# Patient Record
Sex: Female | Born: 1965
Health system: Southern US, Community
[De-identification: ages and names within clinical notes are randomized; demographics above are authoritative.]

## PROBLEM LIST (undated history)

## (undated) DIAGNOSIS — I10 Essential (primary) hypertension: Secondary | ICD-10-CM

## (undated) HISTORY — DX: Essential (primary) hypertension: I10

---

## 2002-10-30 ENCOUNTER — Encounter: Admission: RE | Admit: 2002-10-30 | Discharge: 2002-10-30 | Payer: Self-pay | Admitting: Family Medicine

## 2002-11-06 ENCOUNTER — Encounter: Admission: RE | Admit: 2002-11-06 | Discharge: 2002-11-06 | Payer: Self-pay | Admitting: Sports Medicine

## 2002-11-14 ENCOUNTER — Ambulatory Visit (HOSPITAL_COMMUNITY): Admission: RE | Admit: 2002-11-14 | Discharge: 2002-11-14 | Payer: Self-pay | Admitting: Sports Medicine

## 2002-12-11 ENCOUNTER — Encounter: Admission: RE | Admit: 2002-12-11 | Discharge: 2002-12-11 | Payer: Self-pay | Admitting: Family Medicine

## 2003-01-10 ENCOUNTER — Encounter: Admission: RE | Admit: 2003-01-10 | Discharge: 2003-01-10 | Payer: Self-pay | Admitting: Family Medicine

## 2003-01-29 ENCOUNTER — Encounter: Admission: RE | Admit: 2003-01-29 | Discharge: 2003-01-29 | Payer: Self-pay | Admitting: Family Medicine

## 2003-02-11 ENCOUNTER — Encounter: Admission: RE | Admit: 2003-02-11 | Discharge: 2003-02-11 | Payer: Self-pay | Admitting: Family Medicine

## 2003-02-18 ENCOUNTER — Encounter: Admission: RE | Admit: 2003-02-18 | Discharge: 2003-02-18 | Payer: Self-pay | Admitting: Sports Medicine

## 2003-03-05 ENCOUNTER — Encounter: Admission: RE | Admit: 2003-03-05 | Discharge: 2003-03-05 | Payer: Self-pay | Admitting: Sports Medicine

## 2003-03-22 ENCOUNTER — Encounter: Admission: RE | Admit: 2003-03-22 | Discharge: 2003-03-22 | Payer: Self-pay | Admitting: Sports Medicine

## 2003-03-23 ENCOUNTER — Inpatient Hospital Stay (HOSPITAL_COMMUNITY): Admission: AD | Admit: 2003-03-23 | Discharge: 2003-03-23 | Payer: Self-pay | Admitting: Family Medicine

## 2003-03-23 ENCOUNTER — Encounter: Payer: Self-pay | Admitting: Family Medicine

## 2003-03-27 ENCOUNTER — Inpatient Hospital Stay (HOSPITAL_COMMUNITY): Admission: AD | Admit: 2003-03-27 | Discharge: 2003-03-27 | Payer: Self-pay | Admitting: Obstetrics and Gynecology

## 2003-03-29 ENCOUNTER — Encounter: Admission: RE | Admit: 2003-03-29 | Discharge: 2003-03-29 | Payer: Self-pay | Admitting: *Deleted

## 2003-03-29 ENCOUNTER — Encounter: Admission: RE | Admit: 2003-03-29 | Discharge: 2003-03-29 | Payer: Self-pay | Admitting: Sports Medicine

## 2003-04-02 ENCOUNTER — Encounter: Admission: RE | Admit: 2003-04-02 | Discharge: 2003-04-02 | Payer: Self-pay | Admitting: *Deleted

## 2003-04-05 ENCOUNTER — Encounter: Admission: RE | Admit: 2003-04-05 | Discharge: 2003-04-05 | Payer: Self-pay | Admitting: *Deleted

## 2003-04-06 ENCOUNTER — Inpatient Hospital Stay (HOSPITAL_COMMUNITY): Admission: AD | Admit: 2003-04-06 | Discharge: 2003-04-06 | Payer: Self-pay | Admitting: Family Medicine

## 2003-04-08 ENCOUNTER — Inpatient Hospital Stay (HOSPITAL_COMMUNITY): Admission: AD | Admit: 2003-04-08 | Discharge: 2003-04-08 | Payer: Self-pay | Admitting: Obstetrics & Gynecology

## 2003-04-08 ENCOUNTER — Encounter: Admission: RE | Admit: 2003-04-08 | Discharge: 2003-04-08 | Payer: Self-pay | Admitting: Family Medicine

## 2003-04-09 ENCOUNTER — Inpatient Hospital Stay (HOSPITAL_COMMUNITY): Admission: AD | Admit: 2003-04-09 | Discharge: 2003-04-10 | Payer: Self-pay | Admitting: *Deleted

## 2004-01-23 ENCOUNTER — Encounter: Admission: RE | Admit: 2004-01-23 | Discharge: 2004-01-23 | Payer: Self-pay | Admitting: Family Medicine

## 2004-01-24 ENCOUNTER — Encounter (INDEPENDENT_AMBULATORY_CARE_PROVIDER_SITE_OTHER): Payer: Self-pay | Admitting: *Deleted

## 2004-01-24 LAB — CONVERTED CEMR LAB

## 2004-01-30 ENCOUNTER — Encounter: Admission: RE | Admit: 2004-01-30 | Discharge: 2004-01-30 | Payer: Self-pay | Admitting: Family Medicine

## 2004-02-07 ENCOUNTER — Ambulatory Visit (HOSPITAL_COMMUNITY): Admission: RE | Admit: 2004-02-07 | Discharge: 2004-02-07 | Payer: Self-pay | Admitting: Family Medicine

## 2004-03-06 ENCOUNTER — Ambulatory Visit: Payer: Self-pay | Admitting: Family Medicine

## 2004-03-10 ENCOUNTER — Ambulatory Visit: Payer: Self-pay | Admitting: *Deleted

## 2004-03-10 ENCOUNTER — Ambulatory Visit (HOSPITAL_COMMUNITY): Admission: RE | Admit: 2004-03-10 | Discharge: 2004-03-10 | Payer: Self-pay | Admitting: *Deleted

## 2004-04-10 ENCOUNTER — Ambulatory Visit: Payer: Self-pay | Admitting: Sports Medicine

## 2004-05-01 ENCOUNTER — Ambulatory Visit: Payer: Self-pay | Admitting: Sports Medicine

## 2004-05-05 ENCOUNTER — Ambulatory Visit: Payer: Self-pay | Admitting: Sports Medicine

## 2004-05-07 ENCOUNTER — Ambulatory Visit: Payer: Self-pay | Admitting: Family Medicine

## 2004-05-18 ENCOUNTER — Ambulatory Visit: Payer: Self-pay | Admitting: Family Medicine

## 2004-05-27 ENCOUNTER — Ambulatory Visit: Payer: Self-pay | Admitting: Family Medicine

## 2004-06-04 ENCOUNTER — Ambulatory Visit: Payer: Self-pay | Admitting: Family Medicine

## 2004-06-18 ENCOUNTER — Ambulatory Visit: Payer: Self-pay | Admitting: Family Medicine

## 2004-06-25 ENCOUNTER — Ambulatory Visit: Payer: Self-pay | Admitting: *Deleted

## 2004-06-25 ENCOUNTER — Inpatient Hospital Stay (HOSPITAL_COMMUNITY): Admission: AD | Admit: 2004-06-25 | Discharge: 2004-06-27 | Payer: Self-pay | Admitting: *Deleted

## 2004-07-28 ENCOUNTER — Ambulatory Visit: Payer: Self-pay | Admitting: Family Medicine

## 2004-08-19 ENCOUNTER — Ambulatory Visit: Payer: Self-pay | Admitting: Family Medicine

## 2005-03-16 ENCOUNTER — Emergency Department (HOSPITAL_COMMUNITY): Admission: EM | Admit: 2005-03-16 | Discharge: 2005-03-16 | Payer: Self-pay | Admitting: Family Medicine

## 2005-03-22 ENCOUNTER — Encounter (INDEPENDENT_AMBULATORY_CARE_PROVIDER_SITE_OTHER): Payer: Self-pay | Admitting: Specialist

## 2005-03-22 ENCOUNTER — Ambulatory Visit (HOSPITAL_COMMUNITY): Admission: RE | Admit: 2005-03-22 | Discharge: 2005-03-22 | Payer: Self-pay | Admitting: *Deleted

## 2006-08-19 ENCOUNTER — Encounter (INDEPENDENT_AMBULATORY_CARE_PROVIDER_SITE_OTHER): Payer: Self-pay | Admitting: *Deleted

## 2008-03-27 ENCOUNTER — Ambulatory Visit: Payer: Self-pay | Admitting: Family Medicine

## 2010-11-06 NOTE — Op Note (Signed)
NAMELACRESHA, FUSILIER            ACCOUNT NO.:  [id number]   MEDICAL RECORD NO.:  [id number]          PATIENT TYPE:  AMB   LOCATION:  DAY                          FAC[facility]  [facility]   PHYSICIAN:  Vikki Ports, MDDATE OF BIRTH:  [birth year]   DATE OF PROCEDURE:  03/22/2005  DATE OF DISCHARGE:                                 OPERATIVE REPORT   PREOPERATIVE DIAGNOSES:  1.  Symptomatic cholelithiasis.  2.  Elevated bilirubin.   POSTOPERATIVE DIAGNOSIS:  1.  Symptomatic cholelithiasis.  2.  Elevated bilirubin.   PROCEDURE:  Laparoscopic cholecystectomy with intraoperative cholangiogram.   FINDINGS:  Normal cholangiogram.   SURGEON:  Danna Hefty.   ASSISTANT:  None.   ANESTHESIA:  General.   DESCRIPTION:  The patient was taken to the operating room, placed in supine  position. After adequate general anesthesia was induced using endotracheal  tube, the abdomen was prepped and draped in normal sterile fashion. A  transverse infraumbilical incision was made and fascial defect was created.  An 0 Vicryl pursestring suture was placed around the fascial defect and  Hassan trocar was placed in the abdomen. Pneumoperitoneum was obtained and  additional three trocars were placed. The gallbladder was identified,  retracted cephalad. In the neck of the gallbladder, the cystic duct was  easily identified.  A good window was created posterior to it and it was  clipped up on the gallbladder. Small ductotomy was made and cholangiogram  was performed which showed normal filling of the common duct, bilateral  hepatic ducts, and free flow into the duodenum without filling defects.  Cystic duct was then triply clipped and divided. Cystic artery was dissected  in a similar fashion, triply clipped, and divided. Gallbladder was taken off  the gallbladder bed using Bovie electrocautery, placed in EndoCatch bag, and  removed through the umbilical port. Adequate hemostasis was assured. The  fascial defect was closed with the 0 Vicryl pursestring suture. Skin  incisions were closed with subcuticular 4-0 Monocryl. Steri-Strips and  sterile dressings were applied. The patient tolerated the procedure well and  went to PACU in good condition.      Vikki Ports, MD  Electronically Signed    KRH/MEDQ  D:  03/22/2005  T:  03/22/2005  Job:  [id number]

## 2010-11-06 NOTE — Discharge Summary (Signed)
Aimee Mccall, Aimee Mccall            ACCOUNT NO.:  [id number]   MEDICAL RECORD NO.:  [id number]          PATIENT TYPE:  INP   LOCATION:  9141                          FACILITY:  [facility]   PHYSICIAN:  Barney Drain, M.D.    DATE OF BIRTH:  [birth year]   DATE OF ADMISSION:  06/25/2004  DATE OF DISCHARGE:  06/27/2004                                 DISCHARGE SUMMARY   ATTENDING PHYSICIAN:  Conni Elliot, M.D.   DISCHARGE DIAGNOSES:  1.  G4, P3-0-0-3 who presented at 41 weeks who went on to normal spontaneous      vaginal delivery of full-term infant.  2.  Preeclampsia.  3.  Full-term infant, thriving, routine postpartum care.  4.  Family planning, intrauterine device to be placed at 6 weeks.   DISCHARGE MEDICATIONS:  1.  Ibuprofen 600 mg p.o. q.6 h. p.r.n.  2.  Prenatal vitamins daily while breast-feeding.   SPECIAL INSTRUCTIONS:  The patient is to abstain from sexual intercourse for  the next 6 weeks until IUD is placed.   DISPOSITION:  The patient was discharged home in stable condition with her  infant.   BRIEF HISTORY AND PHYSICAL:  Aimee Mccall is a 45 year old G4, P3-0-0-3 who  presented in active labor at 41 weeks, GBS positive, B positive, HIV  negative, rubella immune.  Sterile vaginal examination at presentation was 6  cm with a bulging bag of water, 90% effaced.  Fetal heart tracing showed a  baseline of 130-140 and was reactive.  The patient was contracting every 3-5  minutes.  Otherwise stable physical examination.   COURSE:  1.  Preeclampsia.  The patient has known history of preeclampsia with each      of her three previous deliveries.  During her labor, the patient's blood      pressure was elevated to systolic of 154.  PIH labs were obtained.      Urate was 7.3 and elevated.  LDH 145.  Platelets 202.  Magnesium sulfate      was started and continued throughout delivery until the following      morning.  At that time, the patient's blood pressure had decreased   somewhat.  She was urinating well.  She had 2650 mL of urine output over      12 hours after delivery.  Normal deep tendon reflexes; therefore,      magnesium was discontinued at that time.  The patient was observed for an additional 24 hours, continued to diurese  well and at the time of discharge there was no edema, normal reflexes.  Blood pressure was 114/73.  1.  Normal spontaneous vaginal delivery without anesthesia at 1754 on      January 5.  Full-term female infant with Apgars of 9 at one minute and 9      at five minutes was delivered.  No perineal lacerations.  EBL was      approximately 100 mL.  Intact placenta was delivered spontaneously at      1757.  The patient went on to a complicated postoperative course with      preeclampsia which  quickly resolved and was otherwise healing well      under care at the time of discharge.  2.  Female infant, thriving, stable examination.  Will be followed at [facility] with Dr. Olene Floss.  3.  Family planning.  A Morena IUD has been obtained for the patient and      will be placed at 6 weeks.      SS/MEDQ  D:  06/27/2004  T:  06/27/2004  Job:  [id number]

## 2014-02-20 ENCOUNTER — Ambulatory Visit (INDEPENDENT_AMBULATORY_CARE_PROVIDER_SITE_OTHER): Payer: BC Managed Care – PPO

## 2014-02-20 ENCOUNTER — Ambulatory Visit (INDEPENDENT_AMBULATORY_CARE_PROVIDER_SITE_OTHER): Payer: BC Managed Care – PPO | Admitting: Emergency Medicine

## 2014-02-20 VITALS — BP 152/90 | HR 59 | Temp 98.5°F | Resp 18 | Ht 59.0 in | Wt 146.8 lb

## 2014-02-20 DIAGNOSIS — Z0000 Encounter for general adult medical examination without abnormal findings: Secondary | ICD-10-CM

## 2014-02-20 DIAGNOSIS — M549 Dorsalgia, unspecified: Secondary | ICD-10-CM

## 2014-02-20 DIAGNOSIS — R030 Elevated blood-pressure reading, without diagnosis of hypertension: Secondary | ICD-10-CM

## 2014-02-20 DIAGNOSIS — IMO0001 Reserved for inherently not codable concepts without codable children: Secondary | ICD-10-CM | POA: Insufficient documentation

## 2014-02-20 LAB — POCT GLYCOSYLATED HEMOGLOBIN (HGB A1C): HEMOGLOBIN A1C: 5.1

## 2014-02-20 LAB — POCT URINE PREGNANCY: Preg Test, Ur: NEGATIVE

## 2014-02-20 LAB — GLUCOSE, POCT (MANUAL RESULT ENTRY): POC Glucose: 83 mg/dl (ref 70–99)

## 2014-02-20 MED ORDER — METHOCARBAMOL 500 MG PO TABS: 500.0000 mg | ORAL_TABLET | Freq: Three times a day (TID) | ORAL | Status: DC | PRN

## 2014-02-20 NOTE — Patient Instructions (Signed)
Hipertensión °(Hypertension) °La hipertensión, conocida comúnmente como presión arterial alta, se produce cuando la sangre bombea en las arterias con mucha fuerza. Las arterias son los vasos sanguíneos que transportan la sangre desde el corazón hacia todas las partes del cuerpo. Una lectura de la presión arterial consiste en un número más alto sobre un número más bajo, por ejemplo, 110/72. El número más alto (presión sistólica) corresponde a la presión interna de las arterias cuando el corazón bombea sangre. El número más bajo (presión diastólica) corresponde a la presión interna de las arterias cuando el corazón se relaja. En condiciones ideales, la presión arterial debe ser inferior a 120/80. °La hipertensión fuerza al corazón a trabajar más para bombear la sangre. Las arterias pueden estrecharse o ponerse rígidas. La hipertensión conlleva el riesgo de enfermedad cardíaca, ictus y otros problemas.  °FACTORES DE RIESGO °Algunos factores de riesgo de hipertensión son controlables, pero otros no lo son.  °Entre los factores de riesgo que usted no puede controlar, se incluyen:  °· La raza. El riesgo es mayor para las personas afroamericanas. °· La edad. Los riesgos aumentan con la edad. °· El sexo. Antes de los 45 años, los hombres corren más riesgo que las mujeres. Después de los 65 años, las mujeres corren más riesgo que los hombres. °Entre los factores de riesgo que usted puede controlar, se incluyen: °· No hacer la cantidad suficiente de actividad física o ejercicio. °· Tener sobrepeso. °· Consumir mucha grasa, azúcar, calorías o sal en la dieta. °· Beber alcohol en exceso. °SIGNOS Y SÍNTOMAS °Por lo general, la hipertensión no causa signos o síntomas. La hipertensión demasiado alta (crisis hipertensiva) puede causar dolor de cabeza, ansiedad, falta de aire y hemorragia nasal. °DIAGNÓSTICO  °Para detectar si usted tiene hipertensión, el médico le medirá la presión arterial mientras esté sentado, con el brazo  levantado a la altura del corazón. Debe medirla al menos dos veces en el mismo brazo. Determinadas condiciones pueden causar una diferencia de presión arterial entre el brazo izquierdo y el derecho. El hecho de tener una sola lectura de la presión arterial más alta que lo normal no significa que necesita un tratamiento. En el caso de tener una lectura de la presión arterial con un valor alto, pídale al médico que la verifique nuevamente. °TRATAMIENTO  °El tratamiento de la hipertensión arterial incluye hacer cambios en el estilo de vida y, posiblemente, tomar medicamentos. Un estilo de vida saludable puede ayudar a bajar la presión arterial alta. Quizá deba cambiar algunos hábitos. °Los cambios en el estilo de vida pueden incluir: °· Seguir la dieta DASH. Esta dieta tiene un alto contenido de frutas, verduras y cereales integrales. Incluye poca cantidad de sal, carnes rojas y azúcares agregados. °· Hacer al menos 2 ½ horas de actividad física enérgica todas las semanas. °· Perder peso, si es necesario. °· No fumar. °· Limitar el consumo de bebidas alcohólicas. °· Aprender formas de reducir el estrés. °Si los cambios en el estilo de vida no son suficientes para lograr controlar la presión arterial, el médico puede recetarle medicamentos. Quizá necesite tomar más de uno. Trabaje en conjunto con su médico para comprender los riesgos y los beneficios. °INSTRUCCIONES PARA EL CUIDADO EN EL HOGAR °· Haga que le midan de nuevo la presión arterial según las indicaciones del médico. °· Tome los medicamentos solamente como se lo haya indicado el médico. Siga cuidadosamente las indicaciones. Los medicamentos para la presión arterial deben tomarse según las indicaciones. Los medicamentos pierden eficacia al omitir las dosis. El hecho de omitir   las dosis también aumenta el riesgo de otros problemas. °· No fume. °· Contrólese la presión arterial en su casa según las indicaciones del médico. °SOLICITE ATENCIÓN MÉDICA SI:  °· Piensa  que tiene una reacción alérgica a los medicamentos. °· Tiene mareos o dolores de cabeza con recurrencia. °· Tiene hinchazón en los tobillos. °· Tiene problemas de visión. °SOLICITE ATENCIÓN MÉDICA DE INMEDIATO SI: °· Siente un dolor de cabeza intenso o confusión. °· Siente debilidad inusual, adormecimiento o que se desmayará. °· Siente dolor intenso en el pecho o en el abdomen. °· Vomita repetidas veces. °· Tiene dificultad para respirar. °ASEGÚRESE DE QUE:  °· Comprende estas instrucciones. °· Controlará su afección. °· Recibirá ayuda de inmediato si no mejora o si empeora. °Document Released: 06/07/2005 Document Revised: 10/22/2013 °ExitCare® Patient Information ©2015 [employer]. This information is not intended to replace advice given to you by your health care provider. Make sure you discuss any questions you have with your health care provider. ° °

## 2014-02-20 NOTE — Progress Notes (Signed)
   Subjective:    Patient ID: Aimee Mccall, female    DOB: [birth year], 48 y.o.   MRN: [id number]  HPI  48 year old female presents to [facility] for annual physical.   Patient has upper back pain and neck pain  144/82 BP right arm in room  134/84 BP left arm in room   Pap smear 1 year ago at health department Review of Systems  Constitutional: Negative.   HENT: Negative.   Eyes: Negative.   Respiratory: Negative.   Cardiovascular: Negative.   Endocrine: Negative.   Genitourinary:       She had a Pap smear and mammogram last year her periods are starting to slow down with very little bleeding.  Musculoskeletal:       She has significant pain in the periscapular area and upper back.  Hematological: Negative.   Psychiatric/Behavioral: Negative.        Objective:   Physical Exam  Constitutional: She appears well-developed and well-nourished.  HENT:  Head: Normocephalic and atraumatic.  Eyes: Pupils are equal, round, and reactive to light.  Neck: Normal range of motion. Neck supple.  Cardiovascular: Normal rate and regular rhythm.   Pulmonary/Chest: Effort normal and breath sounds normal. No respiratory distress. She has no wheezes. She has no rales.  Abdominal: Soft. She exhibits no distension. There is no tenderness. There is no guarding.  Musculoskeletal:  The patient has tenderness along the periscapular muscles and lower thoracic muscles.   [facility] reading (PRIMARY) by  Dr Cleta Alberts thoracic spine films are negative Results for orders placed in visit on 02/20/14  GLUCOSE, POCT (MANUAL RESULT ENTRY)      Result Value Ref Range   POC Glucose 83  70 - 99 mg/dl  POCT URINE PREGNANCY      Result Value Ref Range   Preg Test, Ur Negative    POCT GLYCOSYLATED HEMOGLOBIN (HGB A1C)      Result Value Ref Range   Hemoglobin A1C 5.1      No orders of the defined types were placed in this encounter.       Assessment & Plan:  Will treat with Robaxin to see  if that helps with her muscle discomfort. Repeat blood pressures were in normal range we'll treat this with a low salt diet along with exercise and weight-loss

## 2014-02-21 ENCOUNTER — Encounter: Payer: Self-pay | Admitting: *Deleted

## 2014-02-21 LAB — COMPLETE METABOLIC PANEL WITH GFR
ALT: 12 U/L (ref 0–35)
AST: 16 U/L (ref 0–37)
Albumin: 4.6 g/dL (ref 3.5–5.2)
Alkaline Phosphatase: 117 U/L (ref 39–117)
BILIRUBIN TOTAL: 0.6 mg/dL (ref 0.2–1.2)
BUN: 10 mg/dL (ref 6–23)
CO2: 24 meq/L (ref 19–32)
CREATININE: 0.56 mg/dL (ref 0.50–1.10)
Calcium: 9.2 mg/dL (ref 8.4–10.5)
Chloride: 101 mEq/L (ref 96–112)
GLUCOSE: 91 mg/dL (ref 70–99)
Potassium: 3.7 mEq/L (ref 3.5–5.3)
Sodium: 136 mEq/L (ref 135–145)
Total Protein: 7.8 g/dL (ref 6.0–8.3)

## 2014-02-21 LAB — CBC WITH DIFFERENTIAL/PLATELET
BASOS ABS: 0 10*3/uL (ref 0.0–0.1)
Basophils Relative: 0 % (ref 0–1)
EOS ABS: 0.1 10*3/uL (ref 0.0–0.7)
EOS PCT: 1 % (ref 0–5)
HEMATOCRIT: 41.9 % (ref 36.0–46.0)
Hemoglobin: 14.2 g/dL (ref 12.0–15.0)
LYMPHS PCT: 23 % (ref 12–46)
Lymphs Abs: 2 10*3/uL (ref 0.7–4.0)
MCH: 28.4 pg (ref 26.0–34.0)
MCHC: 33.9 g/dL (ref 30.0–36.0)
MCV: 83.8 fL (ref 78.0–100.0)
MONO ABS: 0.5 10*3/uL (ref 0.1–1.0)
Monocytes Relative: 6 % (ref 3–12)
Neutro Abs: 6 10*3/uL (ref 1.7–7.7)
Neutrophils Relative %: 70 % (ref 43–77)
Platelets: 272 10*3/uL (ref 150–400)
RBC: 5 MIL/uL (ref 3.87–5.11)
RDW: 13.6 % (ref 11.5–15.5)
WBC: 8.6 10*3/uL (ref 4.0–10.5)

## 2014-02-21 LAB — LIPID PANEL
CHOL/HDL RATIO: 4.5 ratio
CHOLESTEROL: 156 mg/dL (ref 0–200)
HDL: 35 mg/dL — AB (ref >39)
LDL Cholesterol: 89 mg/dL (ref 0–99)
Triglycerides: 159 mg/dL — ABNORMAL HIGH (ref <150)
VLDL: 32 mg/dL (ref 0–40)

## 2014-02-21 LAB — TSH: TSH: 2.299 u[IU]/mL (ref 0.350–4.500)

## 2016-08-04 DIAGNOSIS — D239 Other benign neoplasm of skin, unspecified: Secondary | ICD-10-CM | POA: Diagnosis not present

## 2016-08-04 DIAGNOSIS — L219 Seborrheic dermatitis, unspecified: Secondary | ICD-10-CM | POA: Diagnosis not present

## 2017-06-10 ENCOUNTER — Encounter: Payer: Self-pay | Admitting: Physician Assistant

## 2017-06-10 ENCOUNTER — Other Ambulatory Visit: Payer: Self-pay

## 2017-06-10 ENCOUNTER — Ambulatory Visit (INDEPENDENT_AMBULATORY_CARE_PROVIDER_SITE_OTHER): Payer: BLUE CROSS/BLUE SHIELD | Admitting: Physician Assistant

## 2017-06-10 VITALS — BP 136/84 | HR 76 | Temp 98.4°F | Resp 16 | Ht 59.0 in | Wt 153.4 lb

## 2017-06-10 DIAGNOSIS — K219 Gastro-esophageal reflux disease without esophagitis: Secondary | ICD-10-CM | POA: Diagnosis not present

## 2017-06-10 DIAGNOSIS — Z23 Encounter for immunization: Secondary | ICD-10-CM

## 2017-06-10 DIAGNOSIS — H6122 Impacted cerumen, left ear: Secondary | ICD-10-CM | POA: Diagnosis not present

## 2017-06-10 MED ORDER — RANITIDINE HCL 150 MG PO TABS: ORAL_TABLET | ORAL | 0 refills | Status: DC

## 2017-06-10 NOTE — Patient Instructions (Signed)
     IF you received an x-ray today, you will receive an invoice from [facility]. Please contact [facility] at [phone] with questions or concerns regarding your invoice.   IF you received labwork today, you will receive an invoice from [facility]. Please contact [facility] at [phone] with questions or concerns regarding your invoice.   Our billing staff will not be able to assist you with questions regarding bills from these companies.  You will be contacted with the lab results as soon as they are available. The fastest way to get your results is to activate your My Chart account. Instructions are located on the last page of this paperwork. If you have not heard from us regarding the results in 2 weeks, please contact this office.     

## 2017-06-10 NOTE — Progress Notes (Addendum)
06/10/2017 5:55 PM   DOB: 08-10-1965 / MRN: [id number]  SUBJECTIVE:  Aimee Mccall is a 51 y.o. female presenting for left ear pain that has been present for the last month.  Associates a decrease in hearing.  No fever.   She complains of a history of mildly elevated BP.  She has never been treated.  Denies CP, SOB, DOE, intractable HA, sudden vision changes.   She complains of GERD. SHe has symptoms on most days of the week and tells me she has been this way for more than 30 years. These symptoms do not awaken her.  Lying back does seems to bring about the symptoms. Sh e has non bloody normal to loose stools most days of the week.    She has No Known Allergies.   She  has a past medical history of Hypertension.    She  reports that  has never smoked. she has never used smokeless tobacco. She reports that she does not drink alcohol or use drugs. She  has no sexual activity history on file. The patient  has no past surgical history on file.  Her family history includes Cancer in her brother and sister.  Review of Systems  Constitutional: Negative for chills and fever.  HENT: Positive for ear pain and hearing loss. Negative for ear discharge and sinus pain.   Respiratory: Negative for stridor.   Cardiovascular: Negative for chest pain.  Gastrointestinal: Negative for nausea.  Skin: Negative for itching and rash.  Neurological: Negative for dizziness.    The problem list and medications were reviewed and updated by myself where necessary and exist elsewhere in the encounter.   OBJECTIVE:  BP 136/84 (BP Location: Right Arm, Patient Position: Sitting, Cuff Size: Normal)   Pulse 76   Temp 98.4 F (36.9 C) (Oral)   Resp 16   Ht 4\' 11"  (1.499 m)   Wt 153 lb 6.4 oz (69.6 kg)   SpO2 97%   BMI 30.98 kg/m   Wt Readings from Last 3 Encounters:  06/10/17 153 lb 6.4 oz (69.6 kg)  02/20/14 146 lb 12.8 oz (66.6 kg)     Physical Exam  Constitutional: She is active.  Non-toxic  appearance.  HENT:  Ears:  Cardiovascular: Normal rate, regular rhythm, S1 normal, S2 normal, normal heart sounds and intact distal pulses. Exam reveals no gallop, no friction rub and no decreased pulses.  No murmur heard. Pulmonary/Chest: Effort normal. No stridor. No tachypnea. No respiratory distress. She has no wheezes. She has no rales.  Abdominal: She exhibits no distension.  Musculoskeletal: She exhibits no edema.  Neurological: She is alert.  Skin: Skin is warm and dry. She is not diaphoretic. No pallor.    No results found for this or any previous visit (from the past 72 hour(s)).  No results found.  ASSESSMENT AND PLAN:  Aimee Mccall was seen today for ear pain.  Diagnoses and all orders for this visit:  Gastroesophageal reflux disease, esophagitis presence not specified: H pylori positive.  Will treat.  Stopping zantac and starting triple therapy.  -     CBC -     Renal Function Panel -     H. pylori breath test -     Hemoglobin A1c -     ranitidine (ZANTAC) 150 MG tablet; Take daily 30 minutes before an evening time snack.  Needs flu shot -     Flu Vaccine QUAD 36+ mos IM  Need for diphtheria-tetanus-pertussis (Tdap) vaccine -  Tdap vaccine greater than or equal to 7yo IM  Impacted cerumen of left ear: Resolved.      The patient is advised to call or return to clinic if she does not see an improvement in symptoms, or to seek the care of the closest emergency department if she worsens with the above plan.   Deliah BostonMichael Clark, MHS, PA-C Primary Care at [facility] 06/10/2017 5:55 PM

## 2017-06-11 LAB — RENAL FUNCTION PANEL
Albumin: 4.4 g/dL (ref 3.5–5.5)
BUN/Creatinine Ratio: 17 (ref 9–23)
BUN: 12 mg/dL (ref 6–24)
CO2: 25 mmol/L (ref 20–29)
CREATININE: 0.7 mg/dL (ref 0.57–1.00)
Calcium: 9.4 mg/dL (ref 8.7–10.2)
Chloride: 101 mmol/L (ref 96–106)
GFR calc Af Amer: 118 mL/min/{1.73_m2} (ref >59)
GFR calc non Af Amer: 102 mL/min/{1.73_m2} (ref >59)
Glucose: 106 mg/dL — ABNORMAL HIGH (ref 65–99)
PHOSPHORUS: 4.1 mg/dL (ref 2.5–4.5)
Potassium: 3.9 mmol/L (ref 3.5–5.2)
SODIUM: 142 mmol/L (ref 134–144)

## 2017-06-11 LAB — CBC
Hematocrit: 40.1 % (ref 34.0–46.6)
Hemoglobin: 13.8 g/dL (ref 11.1–15.9)
MCH: 27.9 pg (ref 26.6–33.0)
MCHC: 34.4 g/dL (ref 31.5–35.7)
MCV: 81 fL (ref 79–97)
PLATELETS: 315 10*3/uL (ref 150–379)
RBC: 4.94 x10E6/uL (ref 3.77–5.28)
RDW: 13.8 % (ref 12.3–15.4)
WBC: 9.3 10*3/uL (ref 3.4–10.8)

## 2017-06-11 LAB — HEMOGLOBIN A1C
Est. average glucose Bld gHb Est-mCnc: 114 mg/dL
Hgb A1c MFr Bld: 5.6 % (ref 4.8–5.6)

## 2017-06-13 LAB — H. PYLORI BREATH TEST: H pylori Breath Test: POSITIVE — AB

## 2017-06-13 MED ORDER — AMOXICILLIN 875 MG PO TABS: 875.0000 mg | ORAL_TABLET | Freq: Two times a day (BID) | ORAL | 0 refills | Status: AC

## 2017-06-13 MED ORDER — CLARITHROMYCIN 500 MG PO TABS: 500.0000 mg | ORAL_TABLET | Freq: Two times a day (BID) | ORAL | 0 refills | Status: DC

## 2017-06-13 MED ORDER — PANTOPRAZOLE SODIUM 40 MG PO TBEC: 40.0000 mg | DELAYED_RELEASE_TABLET | Freq: Every day | ORAL | 0 refills | Status: DC

## 2017-06-13 NOTE — Addendum Note (Signed)
Addended by: Ofilia NeasLARK, Kahmya Pinkham L on: 06/13/2017 01:56 PM   Modules accepted: Orders

## 2017-07-22 ENCOUNTER — Ambulatory Visit: Payer: Self-pay | Admitting: Physician Assistant

## 2017-07-25 ENCOUNTER — Ambulatory Visit: Payer: BLUE CROSS/BLUE SHIELD | Admitting: Physician Assistant

## 2017-07-25 VITALS — BP 144/86 | HR 71 | Temp 98.3°F | Resp 16 | Ht 59.0 in | Wt 155.4 lb

## 2017-07-25 DIAGNOSIS — K2100 Gastro-esophageal reflux disease with esophagitis, without bleeding: Secondary | ICD-10-CM

## 2017-07-25 DIAGNOSIS — A048 Other specified bacterial intestinal infections: Secondary | ICD-10-CM | POA: Diagnosis not present

## 2017-07-25 MED ORDER — AMOXICILLIN 875 MG PO TABS
875.0000 mg | ORAL_TABLET | Freq: Two times a day (BID) | ORAL | 0 refills | Status: AC
Start: 2017-07-25 — End: 2017-08-04

## 2017-07-25 MED ORDER — METRONIDAZOLE 500 MG PO TABS: 500.0000 mg | ORAL_TABLET | Freq: Two times a day (BID) | ORAL | 0 refills | Status: DC

## 2017-07-25 MED ORDER — ONDANSETRON HCL 4 MG PO TABS: 4.0000 mg | ORAL_TABLET | Freq: Three times a day (TID) | ORAL | 0 refills | Status: DC | PRN

## 2017-07-25 MED ORDER — PANTOPRAZOLE SODIUM 40 MG PO TBEC: 40.0000 mg | DELAYED_RELEASE_TABLET | Freq: Every day | ORAL | 0 refills | Status: DC

## 2017-07-25 NOTE — Progress Notes (Signed)
07/25/2017 11:04 AM   DOB: 05-26-1966 / MRN: [id number]  SUBJECTIVE:  Aimee Mccall is a 52 y.o. female presenting for recheck of GERD.  An interpretor via [employer] is being utilized.  She is not taking the PPI as prescribed. She did take take some of the treatment regimen. She did this for about three days and had to stop due to  A bitter taste in her mouth. She tells me that her stomach is not doing well today.  She complains of continued burning with burning going up into the esophagus.  This is worse when she lays down.  She stopped taking the PPI when she stopped taking the antibiotics.  She has No Known Allergies.   She  has a past medical history of Hypertension.    She  reports that  has never smoked. she has never used smokeless tobacco. She reports that she does not drink alcohol or use drugs. She  has no sexual activity history on file. The patient  has no past surgical history on file.  Her family history includes Cancer in her brother and sister.  Review of Systems  Constitutional: Negative for chills, diaphoresis and fever.  Respiratory: Negative for cough, hemoptysis, sputum production, shortness of breath and wheezing.   Cardiovascular: Negative for chest pain, orthopnea and leg swelling.  Gastrointestinal: Positive for abdominal pain and heartburn. Negative for blood in stool, constipation, diarrhea and nausea.  Skin: Negative for rash.  Neurological: Negative for dizziness.    The problem list and medications were reviewed and updated by myself where necessary and exist elsewhere in the encounter.   OBJECTIVE:  BP (!) 144/86   Pulse 71   Temp 98.3 F (36.8 C) (Oral)   Resp 16   Ht 4\' 11"  (1.499 m)   Wt 155 lb 6.4 oz (70.5 kg)   SpO2 94%   BMI 31.39 kg/m   Wt Readings from Last 3 Encounters:  07/25/17 155 lb 6.4 oz (70.5 kg)  06/10/17 153 lb 6.4 oz (69.6 kg)  02/20/14 146 lb 12.8 oz (66.6 kg)   Temp Readings from Last 3 Encounters:  07/25/17 98.3  F (36.8 C) (Oral)  06/10/17 98.4 F (36.9 C) (Oral)  02/20/14 98.5 F (36.9 C) (Oral)   BP Readings from Last 3 Encounters:  07/25/17 (!) 144/86  06/10/17 136/84  02/20/14 (!) 152/90   Pulse Readings from Last 3 Encounters:  07/25/17 71  06/10/17 76  02/20/14 (!) 59     Physical Exam  Constitutional: She is active.  Non-toxic appearance.  Cardiovascular: Normal rate, regular rhythm, S1 normal, S2 normal, normal heart sounds and intact distal pulses. Exam reveals no gallop, no friction rub and no decreased pulses.  No murmur heard. Pulmonary/Chest: Effort normal. No stridor. No tachypnea. No respiratory distress. She has no wheezes. She has no rales.  Abdominal: She exhibits no distension.  Musculoskeletal: She exhibits no edema.  Neurological: She is alert.  Skin: Skin is warm and dry. She is not diaphoretic. No pallor.    No results found for this or any previous visit (from the past 72 hour(s)).  No results found.  ASSESSMENT AND PLAN:  Shelitha was seen today for gastroesophageal reflux.  Diagnoses and all orders for this visit:  H. pylori infection: Patient not tolerating previous drug regimen most likely secondary to Biaxin.  I am restarting her on new medications.  And I have tried to explain the mechanisms of action to her mostly social understand that  the PPI is for the gastritis and the antibiotics are to cure the H. pylori infection.  She tells me she understands and will try to stay on the medication as prescribed.  She will come back in 1 month at which time if she is doing well I will likely stop her PPI.  She will come back sooner if she is not tolerating the medication at which point I will likely place her on Levaquin given its once daily dosing and long half-life -     metroNIDAZOLE (FLAGYL) 500 MG tablet; Take 1 tablet (500 mg total) by mouth 2 (two) times daily. Don't drink any alcohol with this medication. -     amoxicillin (AMOXIL) 875 MG tablet; Take 1  tablet (875 mg total) by mouth 2 (two) times daily for 10 days.  Gastroesophageal reflux disease with esophagitis -     pantoprazole (PROTONIX) 40 MG tablet; Take 1 tablet (40 mg total) by mouth daily. Do not stop taking thi medication. -     ondansetron (ZOFRAN) 4 MG tablet; Take 1 tablet (4 mg total) by mouth every 8 (eight) hours as needed for nausea or vomiting.    The patient is advised to call or return to clinic if she does not see an improvement in symptoms, or to seek the care of the closest emergency department if she worsens with the above plan.   Deliah BostonMichael Adamari Frede, MHS, PA-C Primary Care at [facility] 07/25/2017 11:04 AM

## 2017-07-25 NOTE — Patient Instructions (Addendum)
  I will see her back in 1 month.  Please try to stay on the medications as best you can.  If you are having side effects and feel that she have to stop the medication please come back in sooner.  There are a lot of different antibiotic alternatives that we can try to cure your H. pylori infection.   IF you received an x-ray today, you will receive an invoice [facility]. Please contact [facility] at [phone] with questions or concerns regarding your invoice.   IF you received labwork today, you will receive an invoice from [facility]. Please contact [facility] at [phone] with questions or concerns regarding your invoice.   Our billing staff will not be able to assist you with questions regarding bills from these companies.  You will be contacted with the lab results as soon as they are available. The fastest way to get your results is to activate your My Chart account. Instructions are located on the last page of this paperwork. If you have not heard from us regarding the results in 2 weeks, please contact this office.

## 2017-07-28 ENCOUNTER — Ambulatory Visit: Payer: BLUE CROSS/BLUE SHIELD | Admitting: Urgent Care

## 2017-07-29 ENCOUNTER — Ambulatory Visit: Payer: BLUE CROSS/BLUE SHIELD | Admitting: Physician Assistant

## 2017-08-18 ENCOUNTER — Other Ambulatory Visit: Payer: Self-pay | Admitting: Physician Assistant

## 2017-08-18 DIAGNOSIS — K2100 Gastro-esophageal reflux disease with esophagitis, without bleeding: Secondary | ICD-10-CM

## 2017-08-22 ENCOUNTER — Ambulatory Visit: Payer: BLUE CROSS/BLUE SHIELD | Admitting: Physician Assistant

## 2017-08-26 ENCOUNTER — Encounter: Payer: Self-pay | Admitting: Physician Assistant

## 2017-08-26 ENCOUNTER — Ambulatory Visit: Payer: BLUE CROSS/BLUE SHIELD | Admitting: Physician Assistant

## 2017-08-26 VITALS — BP 146/92 | HR 77 | Temp 98.7°F | Resp 17 | Ht 60.0 in | Wt 154.0 lb

## 2017-08-26 DIAGNOSIS — K2100 Gastro-esophageal reflux disease with esophagitis, without bleeding: Secondary | ICD-10-CM

## 2017-08-26 NOTE — Patient Instructions (Signed)
     IF you received an x-ray today, you will receive an invoice from [facility]. Please contact [facility] at [phone] with questions or concerns regarding your invoice.   IF you received labwork today, you will receive an invoice from [facility]. Please contact [facility] at [phone] with questions or concerns regarding your invoice.   Our billing staff will not be able to assist you with questions regarding bills from these companies.  You will be contacted with the lab results as soon as they are available. The fastest way to get your results is to activate your My Chart account. Instructions are located on the last page of this paperwork. If you have not heard from us regarding the results in 2 weeks, please contact this office.     

## 2017-08-26 NOTE — Progress Notes (Signed)
    08/26/2017 9:32 AM   DOB: 1966/02/22 / MRN: [id number]  SUBJECTIVE:  Aimee CoombesCatalina Mccall is a 52 y.o. female presenting for recheck of H. pylori.  She states she is feeling much better today.  Denies blood in the stool.  Was able to complete antibiotic therapy this time.  Continues taking the pantoprazole.  She has No Known Allergies.   She  has a past medical history of Hypertension.    She  reports that  has never smoked. she has never used smokeless tobacco. She reports that she does not drink alcohol or use drugs. She  has no sexual activity history on file. The patient  has no past surgical history on file.  Her family history includes Cancer in her brother and sister.  ROS  As per HPI otherwise negative.  The problem list and medications were reviewed and updated by myself where necessary and exist elsewhere in the encounter.   OBJECTIVE:  BP (!) 146/92   Pulse 77   Temp 98.7 F (37.1 C) (Oral)   Resp 17   Ht 5' (1.524 m)   Wt 154 lb (69.9 kg)   SpO2 98%   BMI 30.08 kg/m   Physical Exam  Constitutional: She is active.  Non-toxic appearance.  Cardiovascular: Normal rate, regular rhythm, S1 normal, S2 normal, normal heart sounds and intact distal pulses. Exam reveals no gallop, no friction rub and no decreased pulses.  No murmur heard. Pulmonary/Chest: Effort normal. No stridor. No tachypnea. No respiratory distress. She has no wheezes. She has no rales.  Abdominal: Soft. Bowel sounds are normal. She exhibits no distension and no mass. There is no tenderness. There is no rebound and no guarding.  Musculoskeletal: She exhibits no edema.  Neurological: She is alert.  Skin: Skin is warm and dry. She is not diaphoretic. No pallor.    No results found for this or any previous visit (from the past 72 hour(s)).  No results found.  ASSESSMENT AND PLAN:  Aimee DumasCatalina was seen today for abdominal pain and sore throat.  Diagnoses and all orders for this  visit:  Gastroesophageal reflux disease with esophagitis: Patient completing therapy.  She is feeling better.This is an old problem.  I will do a test of cure today.  She will continue taking pantoprazole. -     H. pylori breath test    The patient is advised to call or return to clinic if she does not see an improvement in symptoms, or to seek the care of the closest emergency department if she worsens with the above plan.   Aimee BostonMichael Sundus Pete, MHS, PA-C Primary Care at [facility] 08/26/2017 9:32 AM

## 2017-08-30 LAB — H. PYLORI BREATH TEST: H pylori Breath Test: POSITIVE — AB

## 2017-08-31 ENCOUNTER — Other Ambulatory Visit: Payer: Self-pay | Admitting: Physician Assistant

## 2017-08-31 MED ORDER — AMOXICILLIN 500 MG PO CAPS: 1000.0000 mg | ORAL_CAPSULE | Freq: Three times a day (TID) | ORAL | 0 refills | Status: DC

## 2017-08-31 NOTE — Progress Notes (Signed)
Patient failed multiple therapy.  Per Marisue [facility]ble[facility] guide amoxicillin 14 days is reasonable.

## 2017-09-18 ENCOUNTER — Other Ambulatory Visit: Payer: Self-pay | Admitting: Physician Assistant

## 2017-09-18 DIAGNOSIS — K2100 Gastro-esophageal reflux disease with esophagitis, without bleeding: Secondary | ICD-10-CM

## 2017-10-16 ENCOUNTER — Other Ambulatory Visit: Payer: Self-pay | Admitting: Physician Assistant

## 2017-10-19 ENCOUNTER — Ambulatory Visit (INDEPENDENT_AMBULATORY_CARE_PROVIDER_SITE_OTHER): Payer: PRIVATE HEALTH INSURANCE | Admitting: Physician Assistant

## 2017-10-19 ENCOUNTER — Encounter: Payer: Self-pay | Admitting: Physician Assistant

## 2017-10-19 ENCOUNTER — Other Ambulatory Visit: Payer: Self-pay

## 2017-10-19 VITALS — BP 118/82 | HR 69 | Temp 98.7°F | Resp 18 | Ht 60.0 in | Wt 150.8 lb

## 2017-10-19 DIAGNOSIS — Z1329 Encounter for screening for other suspected endocrine disorder: Secondary | ICD-10-CM

## 2017-10-19 DIAGNOSIS — Z13228 Encounter for screening for other metabolic disorders: Secondary | ICD-10-CM

## 2017-10-19 DIAGNOSIS — A048 Other specified bacterial intestinal infections: Secondary | ICD-10-CM | POA: Diagnosis not present

## 2017-10-19 DIAGNOSIS — Z130 Encounter for screening for diseases of the blood and blood-forming organs and certain disorders involving the immune mechanism: Secondary | ICD-10-CM

## 2017-10-19 DIAGNOSIS — Z1321 Encounter for screening for nutritional disorder: Secondary | ICD-10-CM | POA: Diagnosis not present

## 2017-10-19 MED ORDER — PANTOPRAZOLE SODIUM 40 MG PO TBEC: 40.0000 mg | DELAYED_RELEASE_TABLET | Freq: Every day | ORAL | 1 refills | Status: DC

## 2017-10-19 NOTE — Patient Instructions (Addendum)
  I am glad you are feeling better today Aimee Mccall.  Please take the pantoprazole 40 mg daily and try not to miss any doses.  Please remember to take this first thing in the morning about 30 minutes before breakfast.   IF you received an x-ray today, you will receive an invoice from [facility]. Please contact [facility] at [phone] with questions or concerns regarding your invoice.   IF you received labwork today, you will receive an invoice from [facility]. Please contact [facility] at [phone] with questions or concerns regarding your invoice.   Our billing staff will not be able to assist you with questions regarding bills from these companies.  You will be contacted with the lab results as soon as they are available. The fastest way to get your results is to activate your My Chart account. Instructions are located on the last page of this paperwork. If you have not heard from [place] regarding the results in 2 weeks, please contact this office.

## 2017-10-19 NOTE — Progress Notes (Addendum)
10/21/2017 10:44 AM   DOB: 04/14/1966 / MRN: [id number]  SUBJECTIVE:  Aimee Mccall is a 52 y.o. female presenting for recheck of H. pylori.  She has failed therapy now twice.  Per Marisue Humble guide amoxicillin 1000 mg twice daily was recommended.  She has completed this regimen and states that she feels somewhat improved however will still have symptoms if she misses a dose or 2 of pantoprazole.  She denies dizziness, blood in the stools, malaise and fatigue.  As long she takes pantoprazole she eats without any difficulty and has no GERD symptoms.  She has No Known Allergies.   She  has a past medical history of Hypertension.    She  reports that she has never smoked. She has never used smokeless tobacco. She reports that she does not drink alcohol or use drugs. She  has no sexual activity history on file. The patient  has no past surgical history on file.  Her family history includes Cancer in her brother and sister.  ROS per HPI  The problem list and medications were reviewed and updated by myself where necessary and exist elsewhere in the encounter.   OBJECTIVE:  BP 118/82 (BP Location: Left Arm, Patient Position: Sitting, Cuff Size: Normal)   Pulse 69   Temp 98.7 F (37.1 C) (Oral)   Resp 18   Ht 5' (1.524 m)   Wt 150 lb 12.8 oz (68.4 kg)   SpO2 99%   BMI 29.45 kg/m   Physical Exam  Constitutional: She is oriented to person, place, and time. She appears well-developed.  Eyes: Pupils are equal, round, and reactive to light. EOM are normal.  Cardiovascular: Normal rate.  Pulmonary/Chest: Effort normal.  Abdominal: She exhibits no distension. There is no tenderness.  Musculoskeletal: Normal range of motion.  Neurological: She is alert and oriented to person, place, and time. No cranial nerve deficit.  Skin: Skin is warm and dry. She is not diaphoretic.  Psychiatric: She has a normal mood and affect.  Vitals reviewed.   Results for orders placed or performed in visit on  10/19/17 (from the past 72 hour(s))  CBC     Status: None   Collection Time: 10/19/17  5:20 PM  Result Value Ref Range   WBC 9.2 3.4 - 10.8 x10E3/uL   RBC 4.86 3.77 - 5.28 x10E6/uL   Hemoglobin 13.7 11.1 - 15.9 g/dL   Hematocrit 08.6 57.8 - 46.6 %   MCV 82 79 - 97 fL   MCH 28.2 26.6 - 33.0 pg   MCHC 34.4 31.5 - 35.7 g/dL   RDW 46.9 62.9 - 52.8 %   Platelets 258 150 - 379 x10E3/uL  Hemoglobin A1c     Status: None   Collection Time: 10/19/17  5:20 PM  Result Value Ref Range   Hgb A1c MFr Bld 5.6 4.8 - 5.6 %    Comment:          Prediabetes: 5.7 - 6.4          Diabetes: >6.4          Glycemic control for adults with diabetes: <7.0    Est. average glucose Bld gHb Est-mCnc 114 mg/dL  Lipid panel     Status: Abnormal   Collection Time: 10/19/17  5:20 PM  Result Value Ref Range   Cholesterol, Total 175 100 - 199 mg/dL   Triglycerides 413 (H) 0 - 149 mg/dL   HDL 29 (L) >24 mg/dL   VLDL Cholesterol Cal 57 (  H) 5 - 40 mg/dL   LDL Calculated 89 0 - 99 mg/dL   Chol/HDL Ratio 6.0 (H) 0.0 - 4.4 ratio    Comment:                                   T. Chol/HDL Ratio                                             Men  Women                               1/2 Avg.Risk  3.4    3.3                                   Avg.Risk  5.0    4.4                                2X Avg.Risk  9.6    7.1                                3X Avg.Risk 23.4   11.0   TSH     Status: None   Collection Time: 10/19/17  5:20 PM  Result Value Ref Range   TSH 2.490 0.450 - 4.500 uIU/mL  Iron, TIBC and Ferritin Panel     Status: None   Collection Time: 10/19/17  5:20 PM  Result Value Ref Range   Total Iron Binding Capacity 281 250 - 450 ug/dL   UIBC 161 096 - 045 ug/dL   Iron 48 27 - 409 ug/dL   Iron Saturation 17 15 - 55 %   Ferritin 96 15 - 150 ng/mL  H. pylori breath test     Status: Abnormal   Collection Time: 10/19/17  5:22 PM  Result Value Ref Range   H pylori Breath Test Positive (A) Negative    No results  found.  ASSESSMENT AND PLAN:  Aimee Mccall was seen today for medication refill.  Diagnoses and all orders for this visit:  H. pylori infection: I have tried three different regimens and her result remains positive and she remains symptomatic.  She needs to see GI.  I will refer.  She will continue protonix for now.  -     H. pylori breath test -     pantoprazole (PROTONIX) 40 MG tablet; Take 1 tablet (40 mg total) by mouth daily.  Screening for endocrine, nutritional, metabolic and immunity disorder -     CBC -     Hemoglobin A1c -     Lipid panel -     TSH -     Iron, TIBC and Ferritin Panel -     Care order/instruction:    The patient is advised to call or return to clinic if she does not see an improvement in symptoms, or to seek the care of the closest emergency department if she worsens with the above plan.   Deliah Boston, MHS, PA-C Primary Care at [facility] 10/21/2017 10:44 AM

## 2017-10-20 LAB — TSH: TSH: 2.49 u[IU]/mL (ref 0.450–4.500)

## 2017-10-20 LAB — CBC
HEMOGLOBIN: 13.7 g/dL (ref 11.1–15.9)
Hematocrit: 39.8 % (ref 34.0–46.6)
MCH: 28.2 pg (ref 26.6–33.0)
MCHC: 34.4 g/dL (ref 31.5–35.7)
MCV: 82 fL (ref 79–97)
Platelets: 258 10*3/uL (ref 150–379)
RBC: 4.86 x10E6/uL (ref 3.77–5.28)
RDW: 14.1 % (ref 12.3–15.4)
WBC: 9.2 10*3/uL (ref 3.4–10.8)

## 2017-10-20 LAB — IRON,TIBC AND FERRITIN PANEL
FERRITIN: 96 ng/mL (ref 15–150)
IRON SATURATION: 17 % (ref 15–55)
IRON: 48 ug/dL (ref 27–159)
TIBC: 281 ug/dL (ref 250–450)
UIBC: 233 ug/dL (ref 131–425)

## 2017-10-20 LAB — LIPID PANEL
CHOLESTEROL TOTAL: 175 mg/dL (ref 100–199)
Chol/HDL Ratio: 6 ratio — ABNORMAL HIGH (ref 0.0–4.4)
HDL: 29 mg/dL — ABNORMAL LOW (ref >39)
LDL Calculated: 89 mg/dL (ref 0–99)
Triglycerides: 287 mg/dL — ABNORMAL HIGH (ref 0–149)
VLDL Cholesterol Cal: 57 mg/dL — ABNORMAL HIGH (ref 5–40)

## 2017-10-20 LAB — H. PYLORI BREATH TEST: H pylori Breath Test: POSITIVE — AB

## 2017-10-20 LAB — HEMOGLOBIN A1C
Est. average glucose Bld gHb Est-mCnc: 114 mg/dL
HEMOGLOBIN A1C: 5.6 % (ref 4.8–5.6)

## 2017-10-21 NOTE — Addendum Note (Signed)
Addended by: Ofilia Neas on: 10/21/2017 10:45 AM   Modules accepted: Orders

## 2017-10-24 ENCOUNTER — Ambulatory Visit: Payer: BLUE CROSS/BLUE SHIELD | Admitting: Family Medicine

## 2017-11-25 ENCOUNTER — Telehealth: Payer: Self-pay | Admitting: Physician Assistant

## 2017-11-25 ENCOUNTER — Encounter (INDEPENDENT_AMBULATORY_CARE_PROVIDER_SITE_OTHER): Payer: Self-pay

## 2017-11-25 NOTE — Telephone Encounter (Signed)
Pt's son asked for his mom's lab results from 10-19-17.  Spoke to [facility] and decided to set up a mychart account would be best for patient due to language barrier.  They will set this up today.

## 2017-12-05 ENCOUNTER — Other Ambulatory Visit: Payer: Self-pay

## 2017-12-05 ENCOUNTER — Ambulatory Visit (INDEPENDENT_AMBULATORY_CARE_PROVIDER_SITE_OTHER): Payer: PRIVATE HEALTH INSURANCE | Admitting: Physician Assistant

## 2017-12-05 ENCOUNTER — Encounter: Payer: Self-pay | Admitting: Physician Assistant

## 2017-12-05 VITALS — BP 144/90 | HR 63 | Temp 98.5°F | Ht 60.0 in | Wt 148.9 lb

## 2017-12-05 DIAGNOSIS — B9681 Helicobacter pylori [H. pylori] as the cause of diseases classified elsewhere: Secondary | ICD-10-CM

## 2017-12-05 DIAGNOSIS — H5789 Other specified disorders of eye and adnexa: Secondary | ICD-10-CM

## 2017-12-05 DIAGNOSIS — J029 Acute pharyngitis, unspecified: Secondary | ICD-10-CM

## 2017-12-05 DIAGNOSIS — K2950 Unspecified chronic gastritis without bleeding: Secondary | ICD-10-CM | POA: Diagnosis not present

## 2017-12-05 LAB — POCT RAPID STREP A (OFFICE): RAPID STREP A SCREEN: NEGATIVE

## 2017-12-05 MED ORDER — OLOPATADINE HCL 0.2 % OP SOLN: 1.0000 [drp] | Freq: Every day | OPHTHALMIC | 3 refills | Status: DC

## 2017-12-05 MED ORDER — PANTOPRAZOLE SODIUM 40 MG PO TBEC: 40.0000 mg | DELAYED_RELEASE_TABLET | Freq: Every day | ORAL | 1 refills | Status: DC

## 2017-12-05 NOTE — Patient Instructions (Addendum)
Call Elnoria HowardHung and Loreta AveMann for your GI referral. [phone]  Start back on your heartburn medication.      IF you received an x-ray today, you will receive an invoice from [facility]. Please contact [facility] at [phone] with questions or concerns regarding your invoice.   IF you received labwork today, you will receive an invoice from [facility]. Please contact [facility] at [phone] with questions or concerns regarding your invoice.   Our billing staff will not be able to assist you with questions regarding bills from these companies.  You will be contacted with the lab results as soon as they are available. The fastest way to get your results is to activate your My Chart account. Instructions are located on the last page of this paperwork. If you have not heard from us regarding the results in 2 weeks, please contact this office.

## 2017-12-05 NOTE — Progress Notes (Signed)
12/05/2017 9:02 AM   DOB: [birth year] / MRN: [id number]  SUBJECTIVE:  Aimee Mccall is a 52 y.o. female presenting for sore throat.  This is been going on for longer than 3 weeks at this time.  She has a history of chronic H. pylori intractable to several therapies including amoxicillin for 14 days, triple therapy x2.  She was referred to GI however was unaware that this referral has been made.  Tells me her heartburn is still present and worsening.  Complains of blurry vision over the last 4 to 5 months.  Complains of irritation about the left eye.  No chronic headaches.  No pain with movement of the eye.  No photophobia.  She does not wear contacts or glasses.  She is never seen an eye doctor or optometrist.   She has No Known Allergies.   She  has a past medical history of Hypertension.    She  reports that she has never smoked. She has never used smokeless tobacco. She reports that she does not drink alcohol or use drugs. She  has no sexual activity history on file. The patient  has no past surgical history on file.  Her family history includes Cancer in her brother and sister.  Review of Systems  Constitutional: Negative for chills, diaphoresis and fever.  HENT: Positive for sore throat. Negative for congestion.   Eyes: Positive for blurred vision. Negative for double vision, photophobia, pain, discharge and redness.  Respiratory: Negative for cough, hemoptysis, sputum production, shortness of breath and wheezing.   Cardiovascular: Negative for chest pain, orthopnea and leg swelling.  Gastrointestinal: Positive for heartburn. Negative for abdominal pain, blood in stool, constipation, diarrhea, melena, nausea and vomiting.  Genitourinary: Negative for dysuria, flank pain, frequency, hematuria and urgency.  Skin: Negative for rash.  Neurological: Negative for dizziness, sensory change, speech change, focal weakness and headaches.    The problem list and medications were reviewed  and updated by myself where necessary and exist elsewhere in the encounter.   OBJECTIVE:  BP (!) 144/90 (BP Location: Left Arm, Patient Position: Sitting, Cuff Size: Normal)   Pulse 63   Temp 98.5 F (36.9 C) (Oral)   Ht 5' (1.524 m)   Wt 148 lb 14.4 oz (67.5 kg)   SpO2 97%   BMI 29.08 kg/m   Wt Readings from Last 3 Encounters:  12/05/17 148 lb 14.4 oz (67.5 kg)  10/19/17 150 lb 12.8 oz (68.4 kg)  08/26/17 154 lb (69.9 kg)   Temp Readings from Last 3 Encounters:  12/05/17 98.5 F (36.9 C) (Oral)  10/19/17 98.7 F (37.1 C) (Oral)  08/26/17 98.7 F (37.1 C) (Oral)   BP Readings from Last 3 Encounters:  12/05/17 (!) 144/90  10/19/17 118/82  08/26/17 (!) 146/92   Pulse Readings from Last 3 Encounters:  12/05/17 63  10/19/17 69  08/26/17 77    Visual Acuity Screening   Right eye Left eye Both eyes  Without correction:     With correction: 20/20 20/50 20/25      Physical Exam  Constitutional: She is oriented to person, place, and time. She appears well-nourished. No distress.  HENT:  Vision 20/30 in the left eye assessed by me, 20/25 in the right.  Eyes: Pupils are equal, round, and reactive to light. EOM are normal. Right eye exhibits no chemosis and no discharge. No foreign body present in the right eye. Left eye exhibits no chemosis and no discharge. No foreign body present in the  left eye. Right conjunctiva is not injected. Right conjunctiva has no hemorrhage. Left conjunctiva is not injected. Left conjunctiva has no hemorrhage. Right eye exhibits normal extraocular motion and no nystagmus. Left eye exhibits normal extraocular motion and no nystagmus. Right pupil is round and reactive. Left pupil is round and reactive. Pupils are equal.  Fundoscopic exam:      The right eye shows no AV nicking, no exudate, no hemorrhage and no papilledema. The right eye shows no red reflex.       The left eye shows no AV nicking, no exudate, no hemorrhage and no papilledema. The left  eye shows no red reflex.  Slit lamp exam:      The right eye shows no corneal flare, no corneal ulcer, no foreign body, no hyphema and no hypopyon.       The left eye shows no corneal flare, no corneal ulcer, no foreign body, no hyphema and no hypopyon.  Cardiovascular: Normal rate, regular rhythm, S1 normal, S2 normal, normal heart sounds and intact distal pulses. Exam reveals no gallop, no friction rub and no decreased pulses.  No murmur heard. Pulmonary/Chest: Effort normal. No stridor. No respiratory distress. She has no wheezes. She has no rales.  Abdominal: She exhibits no distension.  Musculoskeletal: She exhibits no edema.  Neurological: She is alert and oriented to person, place, and time. No cranial nerve deficit. Gait normal.  Skin: Skin is dry. She is not diaphoretic.  Psychiatric: She has a normal mood and affect.  Vitals reviewed.   Results for orders placed or performed in visit on 12/05/17 (from the past 72 hour(s))  POCT rapid strep A     Status: Normal   Collection Time: 12/05/17  8:30 AM  Result Value Ref Range   Rapid Strep A Screen Negative Negative    No results found.  ASSESSMENT AND PLAN:  Aimee Mccall was seen today for sore throat.  Diagnoses and all orders for this visit:  Sore throat -     POCT rapid strep A -     Culture, Group A Strep  Helicobacter pylori gastritis (chronic gastritis) I have given her the number to Dr. Elnoria Howard and Loreta Ave.  She call to establish her intake appointment.  I expect she will need a scope for further evaluation of chronic gastritis with positive H. pylori intractable to several therapies.  Levaquin remains an option however I am not comfortable with this given it would most likely require 14 days of therapy.  Defer to GI -     pantoprazole (PROTONIX) 40 MG tablet; Take 1 tablet (40 mg total) by mouth daily.  Eye irritation -     Olopatadine HCl (PATADAY) 0.2 % SOLN; Apply 1 drop to eye daily.    The patient is advised to call  or return to clinic if she does not see an improvement in symptoms, or to seek the care of the closest emergency department if she worsens with the above plan.   Deliah Boston, MHS, PA-C Primary Care at [facility] 12/05/2017 9:02 AM

## 2017-12-08 LAB — CULTURE, GROUP A STREP: STREP A CULTURE: NEGATIVE

## 2018-01-20 ENCOUNTER — Ambulatory Visit: Payer: PRIVATE HEALTH INSURANCE | Admitting: Physician Assistant

## 2021-01-20 ENCOUNTER — Other Ambulatory Visit: Payer: Self-pay

## 2021-01-20 ENCOUNTER — Encounter: Payer: Self-pay | Admitting: Family Medicine

## 2021-01-20 ENCOUNTER — Ambulatory Visit (INDEPENDENT_AMBULATORY_CARE_PROVIDER_SITE_OTHER): Payer: PRIVATE HEALTH INSURANCE | Admitting: Family Medicine

## 2021-01-20 VITALS — BP 160/100 | HR 64 | Ht 60.0 in | Wt 155.0 lb

## 2021-01-20 DIAGNOSIS — Z1231 Encounter for screening mammogram for malignant neoplasm of breast: Secondary | ICD-10-CM | POA: Diagnosis not present

## 2021-01-20 DIAGNOSIS — Z13220 Encounter for screening for lipoid disorders: Secondary | ICD-10-CM

## 2021-01-20 DIAGNOSIS — Z131 Encounter for screening for diabetes mellitus: Secondary | ICD-10-CM

## 2021-01-20 DIAGNOSIS — I10 Essential (primary) hypertension: Secondary | ICD-10-CM | POA: Diagnosis not present

## 2021-01-20 DIAGNOSIS — Z1159 Encounter for screening for other viral diseases: Secondary | ICD-10-CM

## 2021-01-20 DIAGNOSIS — Z0000 Encounter for general adult medical examination without abnormal findings: Secondary | ICD-10-CM | POA: Diagnosis not present

## 2021-01-20 DIAGNOSIS — Z114 Encounter for screening for human immunodeficiency virus [HIV]: Secondary | ICD-10-CM

## 2021-01-20 NOTE — Assessment & Plan Note (Signed)
BP elevated to 160/100 today. Per chart review, her BP has been elevated to 140s-150s systolic and 90s diastolic at multiple prior appointments. Has never been on medication. -Check BMP today -Would likely benefit from anti-hypertensive therapy -Return in 1-2 weeks to discuss lab results and recheck BP. Plan to start medication at that time if persistently elevated

## 2021-01-20 NOTE — Patient Instructions (Addendum)
It was great to meet you!  Things we discussed at today's visit: - We are checking some labs. We will send you a letter with the results or call if they are abnormal.  - Your blood pressure is high today. We are going to start a medication. I will send it to your pharmacy once we have the results of your blood work. - Aim for 150 minutes of exercise per week (you can start small, with 30 minutes per week for example) - I have ordered your mammogram. You need to call to schedule an appointment.   Take care and seek immediate care sooner if you develop any concerns.  Dr. [facility]  Critical care medicine: Principles of diagnosis and management in the adult (4th ed., pp. 5284-1324). Saunders."> Miller's anesthesia (8th ed., pp. 232-250). Saunders.">  Voluntades anticipadas Advance Directive  Las voluntades anticipadas son documentos legales que le permiten tomar decisiones acerca de su tratamiento mdico y atencin de la salud en caso de no poder expresarse usted mismo. Las voluntades anticipadas comunican sus deseos afamiliares, amigos y mdicos. La elaboracin y la redaccin de las voluntades anticipadas deben realizarse con el transcurso del [place], en lugar de que suceda todo a la misma vez. Las voluntades anticipadas se pueden cambiar y Naval architect. Hay diferentes tipos de voluntades anticipadas, por ejemplo: Poder notarial mdico. Testamento vital. Orden de no reanimar (ONR) o de no intentar la reanimacin (ONIR). Apoderado para asuntos de salud y poder notarial mdico Al apoderado para asuntos de salud tambin se le llama representante para asuntos de Freight forwarder. Esta persona es designada para tomar decisiones mdicas en representacin suya cuando usted no pueda tomar decisiones por usted mismo. Por lo general, las Camera operator piden a un amigo o familiar de confianza que acte como su apoderado y represente sus preferencias. Asegrese de tener un acuerdo con  su persona de confianza para que acte como su apoderado. Es posible que un apoderado deba tomar una decisin mdica en su nombre si no se conocen susdeseos. Un poder notarial mdico, tambin llamado poder notarial duradero para asuntos de [place], es un documento legal que nombra a su apoderado para asuntos de Freight forwarder. En funcin de las leyes de [address], es posible que el documento tambin deba estar: [place]. Autenticado ante escribano. Fechado. Copiado. Atestiguado. Incorporado a la historia clnica del paciente. Tambin es posible que desee designar a una persona de confianza para administrar su dinero en caso de que usted no pueda hacerlo. Esto se denomina poder notarial duradero para asuntos financieros. Este es un documento legal diferente del poder notarial duradero para asuntos de salud. Usted puede elegir a su apoderado para asuntos de salud o a una persona diferente para que actecomo su apoderado en los asuntos de dinero. Si no asigna un apoderado, o si se considera que el apoderado no est actuando para su bien, un tribunal podr Sales promotion account executive legal para que acte enrepresentacin suya. El testamento vital El testamento vital es un conjunto de instrucciones en las que se establecen sus deseos acerca de la atencin mdica para el momento en que no pueda expresarlos usted mismo. Los mdicos deben conservar una copia del testamento vital en su historia clnica. Le recomendamos que entregue una copia a familiares o amigos. Con el fin de alertar a sus cuidadores en caso de Radio broadcast assistant, puede colocar una tarjeta en la billetera que indique que tiene un testamento vital y dnde Surveyor, mining. El testamento vital se [place]  si: Tiene una enfermedad terminal. Queda discapacitado. No puede comunicarse ni tomar decisiones. Debe incluir las siguientes decisiones en su testamento vital: El uso o no uso de equipos de soporte vital, como dispositivos de dilisis y Physicist, medical respiratorias  (respiradores). Si desea una ONR o [place]. Esto les indica a los mdicos que no usen Equities trader (RCP) si se le detiene la respiracin o los latidos cardacos. [place] o no uso de alimentacin por sonda. La administracin o no administracin de alimentos y lquidos. Si desea recibir atencin de alivio (cuidados paliativos) cuando el objetivo sea la comodidad ms que la [place]. Si desea donar sus rganos y tejidos. El testamento vital no da instrucciones acerca de la distribucin del dinero nilas propiedades en caso de fallecimiento. ONR u ONIR La ONR es el pedido de no Statistician en el caso de que el corazn o la respiracin se Insurance claims handler. Si no se realiz ni se present [employer] u ONIR, el mdico intentar ayudar a cualquier paciente cuyo corazn o respiracin se haya detenido. Si planea someterse a [place], hable con su mdico sobre elcumplimiento de su ONR u ONIR en caso de que surjan problemas. Qu sucede si no tengo una voluntad anticipada? Algunos estados asignan familiares a cargo de la toma de decisiones para que acten en representacin suya si no tiene una voluntad anticipada. Cada estado tiene sus propias leyes sobre las voluntades anticipadas. Es posible que desee consultar a su mdico, su abogado o al representante estatal acerca de lasleyes de su estado. Resumen Las voluntades anticipadas son documentos legales que le permiten tomar decisiones acerca de su tratamiento mdico y atencin de la salud en caso de no poder expresarse usted mismo. El proceso de Event organiser y Armed forces training and education officer las voluntades anticipadas debe realizarse con el transcurso del [place]. Usted puede cambiar y Economist las voluntades anticipadas en cualquier momento. Las voluntades anticipadas pueden incluir un poder mdico, un testamento vital y Neomia Dear orden ONR u [place]. Esta informacin no tiene Theme park manager el consejo del mdico. Asegresede hacerle al mdico cualquier pregunta que tenga. Document  Revised: 04/03/2020 Document Reviewed: 04/03/2020 Elsevier Patient Education  2022 [employer].  The Journal of Orthopaedic and Sports Physical Therapy, 44(10), 748. [email or web address]">  Cmo aumentar la cantidad de actividad fsica que realiza How to Increase Your Level of Physical Activity Realizar actividad fsica de forma regular es importante para la salud general y Counsellor. La Harley[employer]son de las personas no realizan suficiente actividad fsica. Hay muchas formas sencillas de aumentar la cantidad de actividad fsica que realiza, incluso si no ha estado muy activo en el pasado o si recin estcomenzando. Cmo me puede afectar el aumento de mi actividad fsica? La actividad fsica tiene muchos beneficios tanto a corto como a Air cabin crew. Practicar actividad fsica de [place] regular puede mejorar la salud mental yfsica, as como proporcionar otros beneficios. Beneficios para la salud fsica Ayudar a Geophysical data processor o a Technical brewer peso saludable. Fortalecer los Ex[employer] Corporation y los huesos. Disminuir el riesgo de padecer ciertas enfermedades a largo plazo (crnicas), como enfermedades cardacas, cncer y diabetes. Ser capaz de moverse con mayor facilidad y por perodos ms prolongados sin cansarse (mayor resistencia). Mejorar la capacidad para combatir enfermedades (aumento de la inmunidad). Dormir mejor. Ayudarlo a mantenerse sano a medida que envejece, lo que incluye: Ayudarlo a Photographer movilidad y la capacidad de caminar y Programmer, systems. Prevenir accidentes, como las cadas. Aumentar la expectativa de vida. Beneficios para la salud mental Mejorar el [place]  de nimo y la autoestima. Disminuir la posibilidad de experimentar problemas de salud mental, como ansiedad o depresin. Ayudar a que se sienta bien con su cuerpo. Otros beneficios Encontrar nuevas fuentes de diversin y entretenimiento. Conocer a Economist que comparten un inters comn. Qu puedo hacer para  aumentar la cantidad de actividad fsica que realizo? Para comenzar Si tiene una enfermedad crnica o no ha estado activo durante un tiempo, consulte con un mdico antes de Games developer. Pregntele al mdico qu actividades son seguras para usted. Comience de a poco. Paramedic ejercicios simples con sillas es un buen modo de Corporate investment banker; en especial, si no ha estado activo antes o durante un largo perodo. Establezca objetivos para los cuales trabaje. Consulte al mdico qu cantidad de ejercicio es mejor para usted. En general, la mayora de los adultos deben: Hacer ejercicios de intensidad moderada durante al menos 150 minutos por semana (30 minutos en la DIRECTV de la semana) o ejercicios vigorosos durante al menos 75 minutos por semana o una combinacin de [place]. El ejercicio de intensidad moderada puede incluir caminar a un ritmo rpido, Lobbyist, Education administrator yoga, hacer gimnasia acutica o hacer jardinera. El ejercicio vigoroso implica realizar actividades que requieren ms esfuerzo, como trotar o correr, Microbiologist, nadar o saltar la soga. Realizar ejercicios de fuerza al menos 2 das por [address] Esto puede incluir levantar pesas, hacer ejercicios de Runner, broadcasting/film/video y ejercicios con bandas de resistencia. Considere usar un monitor de [place], como una aplicacin de telfono mvil o un dispositivo como un reloj, que contar la cantidad de pasos que Teacher, adult education. Muchas personas buscan llegar a 10 000 pasos por da. Al elegir actividades Intente encontrar actividades que disfrute. Es ms probable que se comprometa con una rutina de ejercicios si no la siente como una obligacin. Si tiene problemas seos o articulares, elija ejercicios de bajo impacto, como caminar o nadar. Tenga en cuenta estos consejos para un buen resultado con su plan de ejercicios: Busque una pareja de entrenamiento para ayudar con el cumplimiento. nase a un grupo o clase, como una  clase de [place], [place] clase de bicicleta fija o un equipo deportivo. Practique actividad en familia. Salga a caminar, andar en bicicleta o nadar. Incluya una variedad de ejercicios cada semana. Mantngase activo en sus rutinas diarias Adems de sus planes formales de ejercicio, puede encontrar maneras de hacer actividad fsica durante sus hbitos diarios, por ejemplo: Camine o ande en bicicleta al Aleen Campi o a la tienda. Utilice las Microbiologist del ascensor. Estacione lejos de la puerta del [place] o de la tienda. Planifique reuniones para caminar. Camine mientras est hablando por telfono.  Dnde buscar ms informacin Centers for Disease Control and Prevention (Centros para el Control y la Prevencin de Event organiser): CampusCasting.com.pt Toys 'R' Us on The Kroger, Sports & Nutrition (Consejo Presidencial sobre [place], Deportes y Nutricin): www.fitness.gov ChooseMyPlate: https://ball-collins.biz/ Comunquese con un mdico si: Tiene dolor de cabeza, dolores musculares o dolor articular. Se siente mareado o siente que va a desvanecerse cuando realiza actividad fsica. Se desmaya. Siente dolor en el pecho mientras realiza actividad fsica. Resumen La actividad fsica es buena para la mente y el cuerpo a cualquier edad, incluso si recin comienza a IT trainer. Si tiene una enfermedad crnica o no ha estado activo durante un [place], consulte con un mdico antes de aumentar la cantidad de [place] fsica. Elija actividades que sean seguras y placenteras para usted. Pregntele al mdico qu actividades son seguras  para usted. Comience lentamente. Informe al mdico si tiene problemas cuando comienza a aumentar la cantidad de actividad fsica que Biomedical engineerrealiza. Esta informacin no tiene Theme park managercomo fin reemplazar el consejo del mdico. Asegresede hacerle al mdico cualquier pregunta que tenga. Document Revised: 03/12/2019 Document Reviewed: 02/21/2019 Elsevier Patient Education   2022 Arvin[employer]itor[employer]vier Inc.

## 2021-01-20 NOTE — Assessment & Plan Note (Signed)
Patient here for routine physical and to establish care. Due for several health maintenance items today. -Will obtain Hep C and HIV -Check A1c given BMI 30 -Check lipid panel (hx of elevated cholesterol in past, not checked in several years) -Mammogram ordered. Patient will call to schedule -Discussed advanced directives -Exercise counseling provided -Patient to schedule separate appointment for Pap and to discuss colonoscopy

## 2021-01-20 NOTE — Progress Notes (Signed)
   Subjective:   CC: Establish care  HPI:  Aimee Mccall is a very pleasant 55 y.o. female who presents today to establish care.  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 Drinks alcohol very rarely, smoked for 5 years in her teens to twenties, no tobacco use since then, no recreational drugs Sexually active w/husband Does not exercise   Objective:  BP (!) 160/100   Pulse 64   Ht 5' (1.524 m)   Wt 155 lb (70.3 kg)   SpO2 98%   BMI 30.27 kg/m   Vitals and nursing note reviewed  General: NAD, pleasant, able to participate in exam HEENT: TM normal bilaterally, oropharynx normal Neck: no cervical lymphadenopathy, thyroid normal in size, no nodules appreciated Cardiac: RRR, S1 S2 present. normal heart sounds, no murmurs. Respiratory: CTAB, normal effort, No wheezes, rales or rhonchi Abdomen: Bowel sounds present, non-tender, non-distended Extremities: no edema or cyanosis Skin: warm and dry, no rashes noted Neuro: alert, no obvious focal deficits Psych: Normal affect and mood   Assessment & Plan:   Preventative health care Patient here for routine physical and to establish care. Due for several health maintenance items today. -Will obtain Hep C and HIV -Check A1c given BMI 30 -Check lipid panel (hx of elevated cholesterol in past, not checked in several years) -Mammogram ordered. Patient will call to schedule -Discussed advanced directives -Exercise counseling provided -Patient to schedule separate appointment for Pap and to  discuss colonoscopy  Primary hypertension BP elevated to 160/100 today. Per chart review, her BP has been elevated to 140s-150s systolic and 90s diastolic at multiple prior appointments. Has never been on medication. -Check BMP today -Would likely benefit from anti-hypertensive therapy -Return in 1-2 weeks to discuss lab results and recheck BP. Plan to start medication at that time if persistently elevated   Maury Dus, MD [facility] PGY-2

## 2021-01-21 LAB — LIPID PANEL
Chol/HDL Ratio: 5.2 ratio — ABNORMAL HIGH (ref 0.0–4.4)
Cholesterol, Total: 176 mg/dL (ref 100–199)
HDL: 34 mg/dL — ABNORMAL LOW (ref >39)
LDL Chol Calc (NIH): 118 mg/dL — ABNORMAL HIGH (ref 0–99)
Triglycerides: 135 mg/dL (ref 0–149)
VLDL Cholesterol Cal: 24 mg/dL (ref 5–40)

## 2021-01-21 LAB — BASIC METABOLIC PANEL
BUN/Creatinine Ratio: 15 (ref 9–23)
BUN: 9 mg/dL (ref 6–24)
CO2: 23 mmol/L (ref 20–29)
Calcium: 9.5 mg/dL (ref 8.7–10.2)
Chloride: 103 mmol/L (ref 96–106)
Creatinine, Ser: 0.6 mg/dL (ref 0.57–1.00)
Glucose: 88 mg/dL (ref 65–99)
Potassium: 3.9 mmol/L (ref 3.5–5.2)
Sodium: 140 mmol/L (ref 134–144)
eGFR: 107 mL/min/{1.73_m2} (ref >59)

## 2021-01-21 LAB — HCV AB W REFLEX TO QUANT PCR: HCV Ab: <0.1 s/co ratio (ref 0.0–0.9)

## 2021-01-21 LAB — HEMOGLOBIN A1C
Est. average glucose Bld gHb Est-mCnc: 114 mg/dL
Hgb A1c MFr Bld: 5.6 % (ref 4.8–5.6)

## 2021-01-21 LAB — HIV ANTIBODY (ROUTINE TESTING W REFLEX): HIV Screen 4th Generation wRfx: NONREACTIVE

## 2021-01-21 LAB — HCV INTERPRETATION

## 2021-01-29 ENCOUNTER — Ambulatory Visit: Payer: PRIVATE HEALTH INSURANCE

## 2021-02-05 ENCOUNTER — Other Ambulatory Visit: Payer: Self-pay

## 2021-02-05 ENCOUNTER — Ambulatory Visit
Admission: RE | Admit: 2021-02-05 | Discharge: 2021-02-05 | Disposition: A | Discharge status: Home / Self Care | Payer: PRIVATE HEALTH INSURANCE | Source: Ambulatory Visit | Attending: Family Medicine | Admitting: Family Medicine

## 2021-02-05 DIAGNOSIS — Z1231 Encounter for screening mammogram for malignant neoplasm of breast: Secondary | ICD-10-CM

## 2021-02-16 ENCOUNTER — Ambulatory Visit: Payer: PRIVATE HEALTH INSURANCE | Admitting: Family Medicine

## 2021-02-17 ENCOUNTER — Ambulatory Visit: Payer: PRIVATE HEALTH INSURANCE | Admitting: Family Medicine

## 2021-08-03 ENCOUNTER — Emergency Department (HOSPITAL_COMMUNITY)
Admission: EM | Admit: 2021-08-03 | Discharge: 2021-08-04 | Disposition: A | Discharge status: Home / Self Care | Payer: PRIVATE HEALTH INSURANCE | Attending: Emergency Medicine | Admitting: Emergency Medicine

## 2021-08-03 DIAGNOSIS — R29810 Facial weakness: Secondary | ICD-10-CM | POA: Insufficient documentation

## 2021-08-03 DIAGNOSIS — I10 Essential (primary) hypertension: Secondary | ICD-10-CM | POA: Insufficient documentation

## 2021-08-03 DIAGNOSIS — R519 Headache, unspecified: Secondary | ICD-10-CM | POA: Diagnosis present

## 2021-08-03 DIAGNOSIS — G510 Bell's palsy: Secondary | ICD-10-CM | POA: Insufficient documentation

## 2021-08-04 ENCOUNTER — Other Ambulatory Visit: Payer: Self-pay

## 2021-08-04 ENCOUNTER — Encounter (HOSPITAL_COMMUNITY): Payer: Self-pay | Admitting: *Deleted

## 2021-08-04 MED ORDER — ARTIFICIAL TEARS OPHTHALMIC OINT: TOPICAL_OINTMENT | Freq: Two times a day (BID) | OPHTHALMIC | 0 refills | Status: AC | PRN

## 2021-08-04 MED ORDER — PREDNISONE 20 MG PO TABS: ORAL_TABLET | ORAL | 0 refills | Status: DC

## 2021-08-04 NOTE — ED Provider Notes (Signed)
[facility] EMERGENCY DEPARTMENT Provider Note   CSN: [id number] Arrival date & time: 08/03/21  2337     History  Chief Complaint  Patient presents with   Headache    Aimee Mccall is a 56 y.o. female.  The history is provided by the patient and medical records. The history is limited by a language barrier. A language interpreter was used.  Headache  56 year old female with history of hypertension, presenting to the ED with left facial droop.  Saturday morning woke up and noticed this, states left side of her face feels "numb".  She is unable to close her left eye and states that eye feels dry.  She denies any dizziness, blurred vision, focal weakness.  She remains ambulatory.  No history of TIA or stroke in the past.  She has not tried any medications prior to arrival.  Home Medications Prior to Admission medications   Medication Sig Start Date End Date Taking? Authorizing Provider  artificial tears (LACRILUBE) OINT ophthalmic ointment Place into the left eye 2 (two) times daily as needed for dry eyes. 08/04/21  Yes Garlon Hatchet, PA-C  predniSONE (DELTASONE) 20 MG tablet Take 40 mg by mouth daily for 3 days, then 20mg  by mouth daily for 3 days, then 10mg  daily for 3 days 08/04/21  Yes , PA-C      Allergies    Patient has no known allergies.    Review of Systems   Review of Systems  Neurological:  Positive for facial asymmetry.  All other systems reviewed and are negative.  Physical Exam Updated Vital Signs BP (!) 176/103 (BP Location: Left Arm)    Pulse 76    Temp 98.3 F (36.8 C) (Oral)    Resp 18    SpO2 95%   Physical Exam Vitals and nursing note reviewed.  Constitutional:      General: She is not in acute distress.    Appearance: She is well-developed. She is not diaphoretic.  HENT:     Head: Normocephalic and atraumatic.     Right Ear: Tympanic membrane and external ear normal.     Left Ear: Tympanic membrane and external  ear normal.     Ears:     Comments: TM's clear Eyes:     Conjunctiva/sclera: Conjunctivae normal.     Pupils: Pupils are equal, round, and reactive to light.  Neck:     Comments: No rigidity, no meningismus Cardiovascular:     Rate and Rhythm: Normal rate and regular rhythm.     Heart sounds: Normal heart sounds. No murmur heard. Pulmonary:     Effort: Pulmonary effort is normal. No respiratory distress.     Breath sounds: Normal breath sounds. No wheezing or rhonchi.  Abdominal:     General: Bowel sounds are normal.     Palpations: Abdomen is soft.     Tenderness: There is no abdominal tenderness. There is no guarding.  Musculoskeletal:        General: Normal range of motion.     Cervical back: Full passive range of motion without pain, normal range of motion and neck supple. No rigidity.  Skin:    General: Skin is warm and dry.     Findings: No rash.  Neurological:     Mental Status: She is alert and oriented to person, place, and time.     Cranial Nerves: No cranial nerve deficit.     Sensory: No sensory deficit.     Motor:  No tremor or seizure activity.     Comments: AAOx3, conversant with interpreter which is appropriate and goal oriented, left facial droop observed, unable to completely close left eye, cannot raise left eyebrow, asymmetric smile, moving extremities well  Psychiatric:        Behavior: Behavior normal.        Thought Content: Thought content normal.    ED Results / Procedures / Treatments   Labs (all labs ordered are listed, but only abnormal results are displayed) Labs Reviewed - No data to display  EKG None  Radiology No results found.  Procedures Procedures    Medications Ordered in ED Medications - No data to display  ED Course/ Medical Decision Making/ A&P                           Medical Decision Making Risk Prescription drug management.   56 year old female presenting to the ED with left facial droop.  She is awake, alert,  oriented.  Speech is clear and goal oriented with interpreter.  She has no focal neurologic deficits.  She does have left facial droop, inability to close left eye completely, asymmetric smile, and unable to raise left eyebrow.  This is consistent with Bell's palsy.  I have discussed this with patient and husband at bedside through interpreter, they acknowledge understanding. Will start on prednisone taper, lacrilube for eye.  Will need close follow-up with PCP.  Return here for new concerns.  Final Clinical Impression(s) / ED Diagnoses Final diagnoses:  Bell's palsy    Rx / DC Orders ED Discharge Orders          Ordered    predniSONE (DELTASONE) 20 MG tablet        08/04/21 0027    artificial tears (LACRILUBE) OINT ophthalmic ointment  2 times daily PRN        08/04/21 0027              Garlon Hatchet, PA-C 08/04/21 0035    Gilda Crease, MD 08/04/21 [phone]

## 2021-08-04 NOTE — Discharge Instructions (Addendum)
Take the prescribed medication as directed. Follow-up with your doctor. Return to the ED for new or worsening symptoms. 

## 2021-08-04 NOTE — ED Triage Notes (Signed)
Headache and left ear pain that started on Friday. On Saturday, she woke and she has a facial droop on the left side, unable to close the left eye,feels dry.

## 2021-10-06 ENCOUNTER — Other Ambulatory Visit: Payer: Self-pay

## 2021-10-06 ENCOUNTER — Ambulatory Visit (INDEPENDENT_AMBULATORY_CARE_PROVIDER_SITE_OTHER): Payer: BC Managed Care – PPO | Admitting: Family Medicine

## 2021-10-06 ENCOUNTER — Encounter: Payer: Self-pay | Admitting: Family Medicine

## 2021-10-06 VITALS — BP 181/97 | HR 76 | Wt 158.2 lb

## 2021-10-06 DIAGNOSIS — G8929 Other chronic pain: Secondary | ICD-10-CM | POA: Diagnosis not present

## 2021-10-06 DIAGNOSIS — R070 Pain in throat: Secondary | ICD-10-CM

## 2021-10-06 DIAGNOSIS — I10 Essential (primary) hypertension: Secondary | ICD-10-CM | POA: Diagnosis not present

## 2021-10-06 MED ORDER — VALSARTAN-HYDROCHLOROTHIAZIDE 80-12.5 MG PO TABS: 1.0000 | ORAL_TABLET | Freq: Every day | ORAL | 0 refills | Status: AC

## 2021-10-06 NOTE — Patient Instructions (Addendum)
It was great to see you! ? ?Things we discussed at today's visit: ?- Your blood pressure is very high. We are going to start a medication called valsartan-hydrochlorothiazide. Take one pill daily. ?- Check your blood pressure once a day at home. Write down the numbers and bring the written log to your next appointment. ? ?For your throat, you seem to have something called "tonsil stones". The best treatment is salt water gargles and good oral hygiene (brush teeth regularly). You can also use saline nasal spray at night to keep the back of the throat hydrated. ? ?Come back in 2 weeks for blood work. ? ?Take care and seek immediate care sooner if you develop any concerns. ? ?Dr. Rock Nephew ?[facility]  ?

## 2021-10-06 NOTE — Progress Notes (Signed)
? ? ?  SUBJECTIVE:  ? ?CHIEF COMPLAINT / HPI:  ? ?HTN ?Patient concerned that her blood pressure is elevated and she may need medication. She does not check BP at home but occasionally sees spots in her vision and has been told her BP was high previously. Recently had Bell's Palsy 2 months ago and went to the ED where her BP was 176/103. ?Has never been on BP medication in the past. Intended to start after last visit but patient never followed up. ? ?Sore Throat ?Patient states she would like treatment for an infection. Feels like her sore throat has been going on for 2 years. She sees small white dots in the back of her throat most days. Also endorses a bad odor when she wakes up in the morning. ?No fever, no congestion or rhinorrhea, no difficulty swallowing. ? ? ?OBJECTIVE:  ? ?BP (!) 181/97   Pulse 76   Wt 71.8 kg   SpO2 99%   BMI 30.90 kg/m?   ?Gen: alert, well-appearing, NAD ?HEENT: PERRL, EOMI, oropharynx normal without tonsillar edema, erythema or exudates ?Neck: no cervical lymphadenopathy ?CV: RRR, normal S1/S2 without m/r/g ?Resp: normal effort, lungs CTAB ?Neuro: CN II-XII intact, 5/5 strength in all extremities, sensation intact to light touch in all extremities, normal gait ? ?ASSESSMENT/PLAN:  ? ?Primary hypertension ?BP significantly elevated to 185/112 today, repeat measurement 181/97. On chart review, BP has been similarly elevated on multiple occasions in the past. ?-Start valsartan-HCTZ 80-12.5mg  daily ?-Advised home BP monitoring. Bring written log to next appointment ?-f/u in 2 weeks, obtain BMP at that time ?-Recommend urine dipstick at next appt (unable to obtain today, patient had just urinated) ? ?Chronic throat pain ?Symptoms present x2 years. Exam unremarkable today. History consistent with possible tonsilliths. Advised supportive care with salt water gargles, good oral hygiene, prn nasal saline spray for hydration. ?Patient does have hx of chronic H Pylori- wonder whether this could  contribute although seems unlikely. Could consider GI referral in the future as this was previously recommended but she was lost to follow up. ?  ?Spanish interpreter used for duration of encounter ? ?Maury Dus, MD ?[facility]  ?

## 2021-10-07 DIAGNOSIS — G8929 Other chronic pain: Secondary | ICD-10-CM | POA: Insufficient documentation

## 2021-10-07 NOTE — Assessment & Plan Note (Signed)
Symptoms present x2 years. Exam unremarkable today. History consistent with possible tonsilliths. Advised supportive care with salt water gargles, good oral hygiene, prn nasal saline spray for hydration. ?Patient does have hx of chronic H Pylori- wonder whether this could contribute although seems unlikely. Could consider GI referral in the future as this was previously recommended but she was lost to follow up. ?

## 2021-10-07 NOTE — Assessment & Plan Note (Signed)
BP significantly elevated to 185/112 today, repeat measurement 181/97. On chart review, BP has been similarly elevated on multiple occasions in the past. ?-Start valsartan-HCTZ 80-12.5mg  daily ?-Advised home BP monitoring. Bring written log to next appointment ?-f/u in 2 weeks, obtain BMP at that time ?-Recommend urine dipstick at next appt (unable to obtain today, patient had just urinated) ?

## 2021-10-21 ENCOUNTER — Ambulatory Visit: Payer: PRIVATE HEALTH INSURANCE | Admitting: Family Medicine

## 2021-10-26 ENCOUNTER — Ambulatory Visit: Payer: PRIVATE HEALTH INSURANCE | Admitting: Family Medicine

## 2021-10-28 ENCOUNTER — Ambulatory Visit: Payer: PRIVATE HEALTH INSURANCE | Admitting: Family Medicine

## 2023-05-02 IMAGING — MG MM DIGITAL SCREENING BILAT W/ TOMO AND CAD
6 of 10 series · 6 of 30 positions shown · non-contrast
Comparison: Previous exam(s).

CLINICAL DATA: Screening.

EXAM:
DIGITAL SCREENING BILATERAL MAMMOGRAM WITH TOMOSYNTHESIS AND CAD
TECHNIQUE: Bilateral screening digital craniocaudal and mediolateral oblique
mammograms were obtained. Bilateral screening digital breast
tomosynthesis was performed. The images were evaluated with
computer-aided detection.

[R CC synth-2D (1 of 2)]
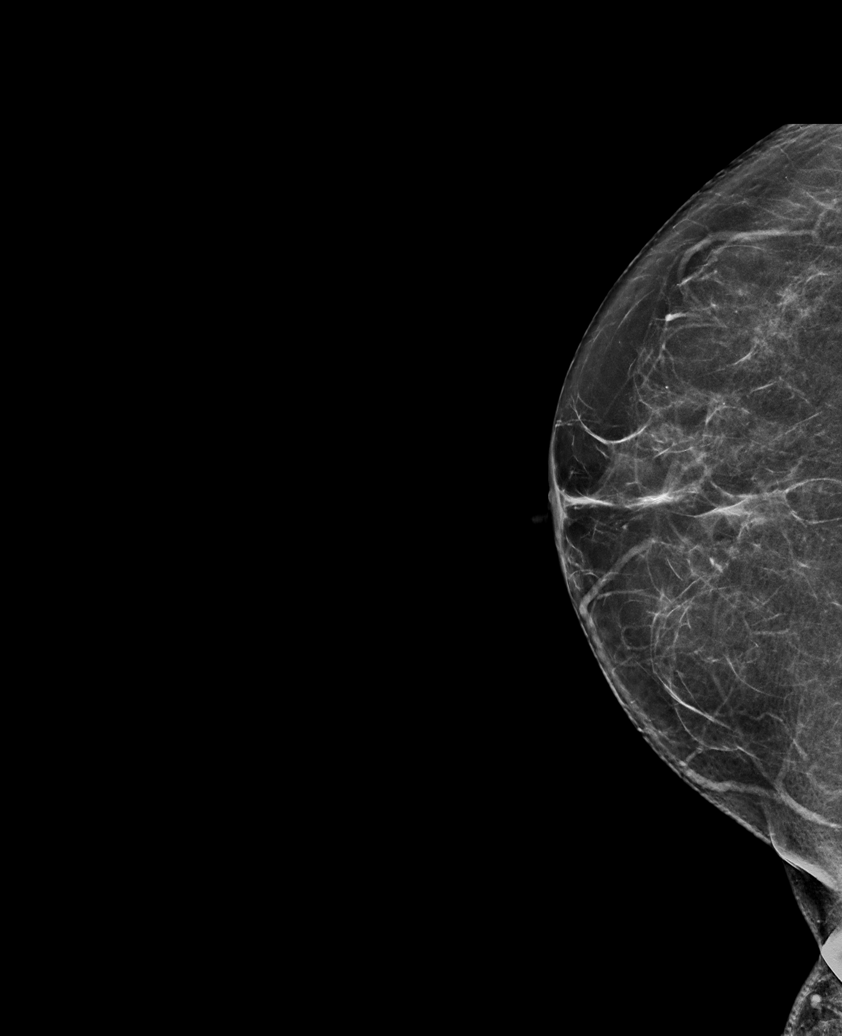

[R CC synth-2D (2 of 2)]
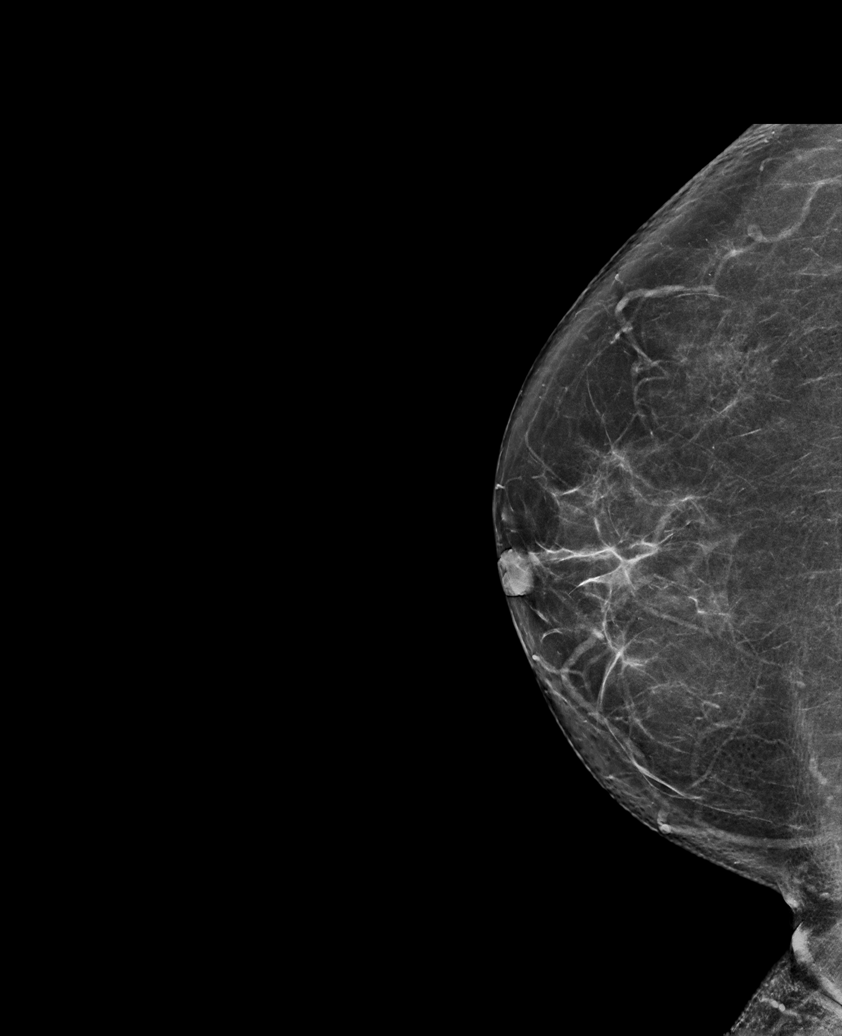

[L CC synth-2D]
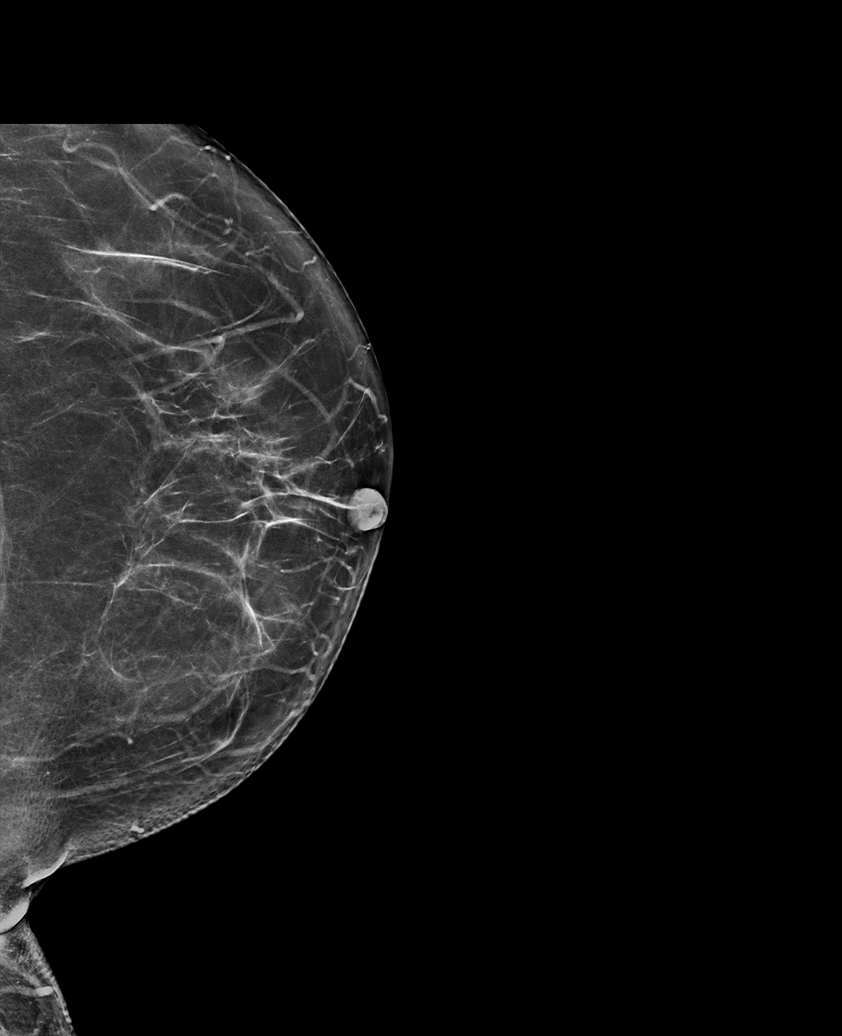

[L MLO synth-2D]
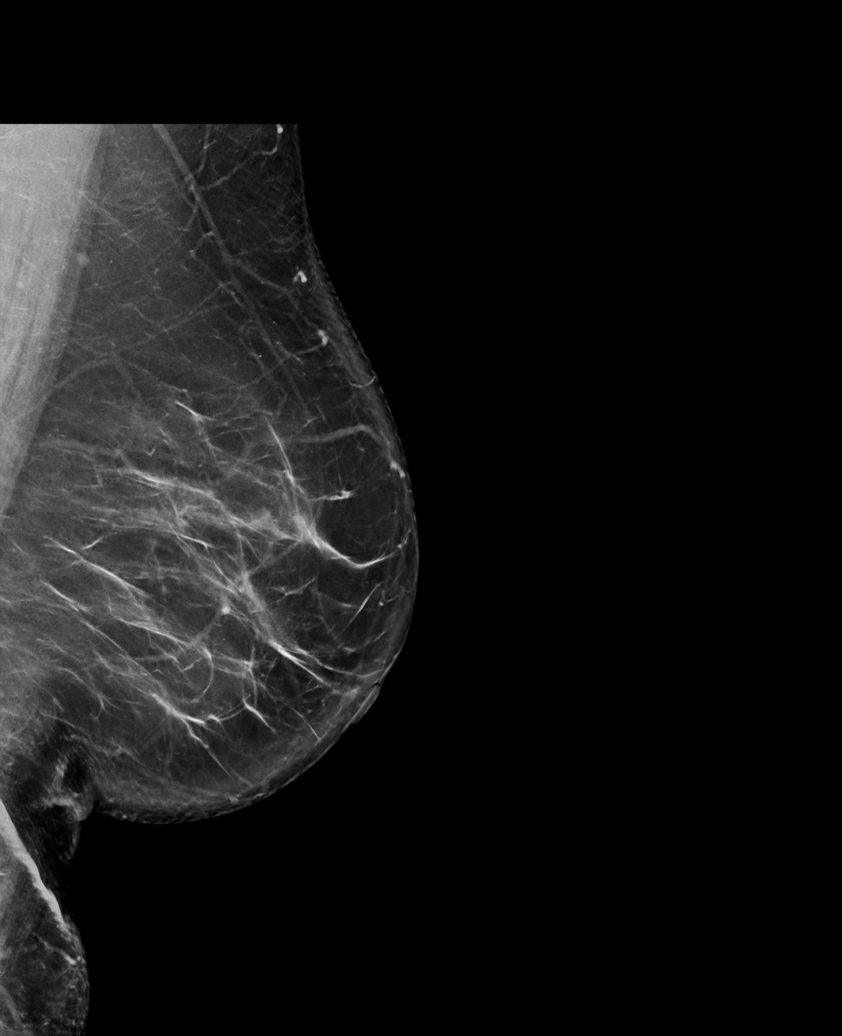

[R MLO synth-2D]
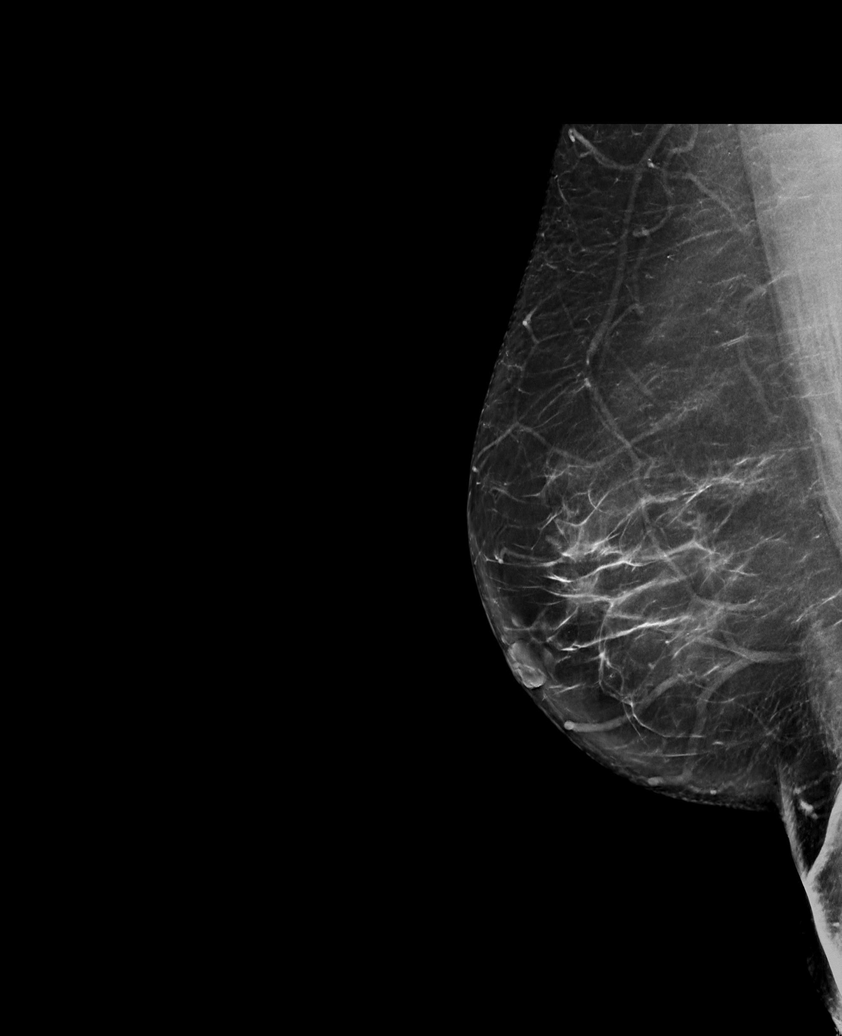

[L MLO tomo · tomo slice 47/92.0]
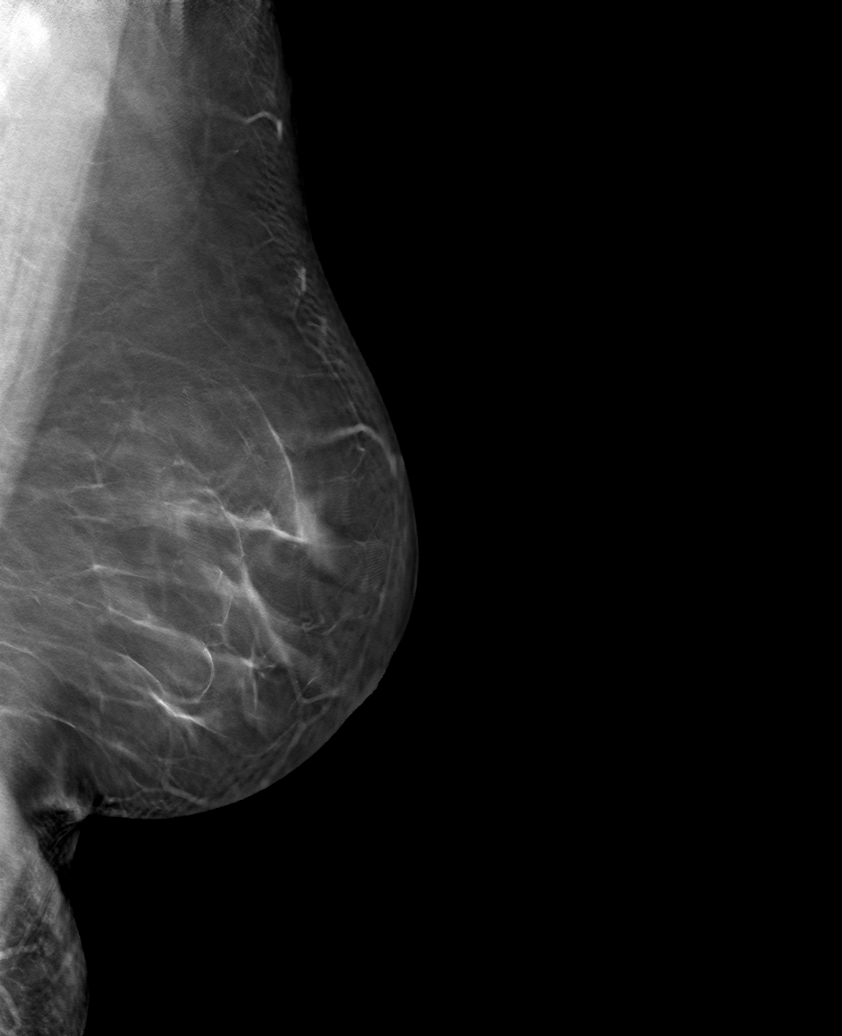

[6 of 30 positions shown; findings below may reference images not displayed]

ACR Breast Density Category b: There are scattered areas of
fibroglandular density.
FINDINGS: There are no findings suspicious for malignancy.
IMPRESSION: No mammographic evidence of malignancy. A result letter of this
screening mammogram will be mailed directly to the patient.

RECOMMENDATION:
Screening mammogram in one year. (Code:51-O-LD2)

BI-RADS CATEGORY  1: Negative.

## 2023-07-07 DIAGNOSIS — Z79899 Other long term (current) drug therapy: Secondary | ICD-10-CM | POA: Diagnosis not present

## 2023-07-07 DIAGNOSIS — B351 Tinea unguium: Secondary | ICD-10-CM | POA: Diagnosis not present

## 2023-11-29 ENCOUNTER — Encounter: Payer: Self-pay | Admitting: *Deleted

## 2024-02-10 ENCOUNTER — Encounter: Payer: Self-pay | Admitting: Family Medicine

## 2024-02-10 ENCOUNTER — Ambulatory Visit (INDEPENDENT_AMBULATORY_CARE_PROVIDER_SITE_OTHER): Admitting: Family Medicine

## 2024-02-10 ENCOUNTER — Other Ambulatory Visit (HOSPITAL_COMMUNITY)
Admission: RE | Admit: 2024-02-10 | Discharge: 2024-02-10 | Disposition: A | Discharge status: Home / Self Care | Source: Ambulatory Visit | Attending: Family Medicine | Admitting: Family Medicine

## 2024-02-10 VITALS — BP 134/82 | HR 76 | Ht 60.0 in | Wt 152.1 lb

## 2024-02-10 DIAGNOSIS — Z124 Encounter for screening for malignant neoplasm of cervix: Secondary | ICD-10-CM | POA: Insufficient documentation

## 2024-02-10 DIAGNOSIS — Z0000 Encounter for general adult medical examination without abnormal findings: Secondary | ICD-10-CM

## 2024-02-10 DIAGNOSIS — Z13228 Encounter for screening for other metabolic disorders: Secondary | ICD-10-CM | POA: Diagnosis not present

## 2024-02-10 DIAGNOSIS — N951 Menopausal and female climacteric states: Secondary | ICD-10-CM

## 2024-02-10 DIAGNOSIS — Z23 Encounter for immunization: Secondary | ICD-10-CM

## 2024-02-10 DIAGNOSIS — R5383 Other fatigue: Secondary | ICD-10-CM | POA: Diagnosis not present

## 2024-02-10 DIAGNOSIS — Z1231 Encounter for screening mammogram for malignant neoplasm of breast: Secondary | ICD-10-CM

## 2024-02-10 DIAGNOSIS — Z1211 Encounter for screening for malignant neoplasm of colon: Secondary | ICD-10-CM

## 2024-02-10 DIAGNOSIS — I10 Essential (primary) hypertension: Secondary | ICD-10-CM | POA: Diagnosis not present

## 2024-02-10 LAB — POCT GLYCOSYLATED HEMOGLOBIN (HGB A1C): HbA1c, POC (controlled diabetic range): 5.6 % (ref 0.0–7.0)

## 2024-02-10 MED ORDER — SHINGRIX 50 MCG/0.5ML IM SUSR: INTRAMUSCULAR | 1 refills | Status: AC

## 2024-02-10 MED ORDER — PAROXETINE MESYLATE 7.5 MG PO CAPS: 7.5000 mg | ORAL_CAPSULE | Freq: Every evening | ORAL | 3 refills | Status: AC | PRN

## 2024-02-10 NOTE — Assessment & Plan Note (Addendum)
 At goal today. Not on meds. Diet/lifestyle controlled. Discussed. Hopeful for improvement in exercise and weight with control of vasomotor menopause sx

## 2024-02-10 NOTE — Patient Instructions (Addendum)
 Start taking paroxetine  7.5 mg every night to help with your menopause symptoms.  This may take 4 to 6 weeks to take effect.  If any of your results from today are abnormal and/or require changes to your medical care, I will give you a call. Otherwise, I will send you a letter in the mail or a message on MyChart.   Please schedule your mammogram per the instructions on the sheet provided to you today.     Please take your printed prescription to your pharmacy to obtain your shingles vaccine.  You should receive a call soon to schedule a colonoscopy.

## 2024-02-10 NOTE — Progress Notes (Signed)
    SUBJECTIVE:   Chief compliant/HPI: annual examination  Aimee Mccall is a 58 y.o. who presents today for an annual exam.   Current concerns include: fatigue, hot flashes since menopause about 4 years ago. Worsening this year. Denies passing out, vaginal bleeding.  Due for Pap smear Last pap: not on record STD testing: declines   OBJECTIVE:   BP 134/82   Pulse 76   Ht 5' (1.524 m)   Wt 152 lb 2 oz (69 kg)   SpO2 97%   BMI 29.71 kg/m    General: NAD, pleasant, able to participate in exam Cardiac: RRR, no murmurs auscultated Respiratory: CTAB, normal WOB Abdomen: soft, non-tender, non-distended, normoactive bowel sounds Extremities: warm and well perfused, no edema or cyanosis Skin: warm and dry, no rashes noted Neuro: alert, no obvious focal deficits, speech normal Psych: Normal affect and mood   Pelvic exam: normal external genitalia, vulva, vagina, cervix, uterus and adnexa, exam chaperoned by Nelson CMA.  ASSESSMENT/PLAN:   Assessment & Plan Annual physical exam Pap done today, HPV cotest Order BMP, lipid panel, A1c for screening and hx htn (A1c normal today) Ordered mammo and shingles rx Placed GI referral for colonoscopy Declines sti testing No smoking hx Primary hypertension At goal today. Not on meds. Diet/lifestyle controlled. Discussed. Hopeful for improvement in exercise and weight with control of vasomotor menopause sx  Other fatigue Ongoing since menopause - see below No vaginal bleeding CBC, TSH, BMP Vasomotor symptoms due to menopause Trial paroxetine  7.5mg  at bedtime, discussed this may take 4-6 weeks for full effect       Follow up in 1 year or sooner if indicated.  MyChart Activation: Already signed up  Payton Coward, MD [facility]

## 2024-02-11 LAB — CBC WITH DIFFERENTIAL/PLATELET
Basophils Absolute: 0.1 x10E3/uL (ref 0.0–0.2)
Basos: 1 %
EOS (ABSOLUTE): 0.1 x10E3/uL (ref 0.0–0.4)
Eos: 1 %
Hematocrit: 45.4 % (ref 34.0–46.6)
Hemoglobin: 14.9 g/dL (ref 11.1–15.9)
Immature Grans (Abs): 0 x10E3/uL (ref 0.0–0.1)
Immature Granulocytes: 0 %
Lymphocytes Absolute: 2.3 x10E3/uL (ref 0.7–3.1)
Lymphs: 23 %
MCH: 28.4 pg (ref 26.6–33.0)
MCHC: 32.8 g/dL (ref 31.5–35.7)
MCV: 87 fL (ref 79–97)
Monocytes Absolute: 0.8 x10E3/uL (ref 0.1–0.9)
Monocytes: 8 %
Neutrophils Absolute: 6.7 x10E3/uL (ref 1.4–7.0)
Neutrophils: 67 %
Platelets: 300 x10E3/uL (ref 150–450)
RBC: 5.24 x10E6/uL (ref 3.77–5.28)
RDW: 13.3 % (ref 11.7–15.4)
WBC: 10 x10E3/uL (ref 3.4–10.8)

## 2024-02-11 LAB — LIPID PANEL
Chol/HDL Ratio: 4.9 ratio — ABNORMAL HIGH (ref 0.0–4.4)
Cholesterol, Total: 210 mg/dL — ABNORMAL HIGH (ref 100–199)
HDL: 43 mg/dL (ref >39)
LDL Chol Calc (NIH): 144 mg/dL — ABNORMAL HIGH (ref 0–99)
Triglycerides: 130 mg/dL (ref 0–149)
VLDL Cholesterol Cal: 23 mg/dL (ref 5–40)

## 2024-02-11 LAB — TSH RFX ON ABNORMAL TO FREE T4: TSH: 2.24 u[IU]/mL (ref 0.450–4.500)

## 2024-02-11 LAB — BASIC METABOLIC PANEL WITH GFR
BUN/Creatinine Ratio: 25 — ABNORMAL HIGH (ref 9–23)
BUN: 15 mg/dL (ref 6–24)
CO2: 23 mmol/L (ref 20–29)
Calcium: 9.5 mg/dL (ref 8.7–10.2)
Chloride: 101 mmol/L (ref 96–106)
Creatinine, Ser: 0.61 mg/dL (ref 0.57–1.00)
Glucose: 87 mg/dL (ref 70–99)
Potassium: 3.7 mmol/L (ref 3.5–5.2)
Sodium: 140 mmol/L (ref 134–144)
eGFR: 105 mL/min/1.73 (ref >59)

## 2024-02-13 LAB — CYTOLOGY - PAP
Adequacy: ABSENT
Comment: NEGATIVE
Diagnosis: NEGATIVE
High risk HPV: NEGATIVE

## 2024-02-14 ENCOUNTER — Ambulatory Visit (HOSPITAL_COMMUNITY): Payer: Self-pay | Admitting: Family Medicine

## 2024-03-07 ENCOUNTER — Inpatient Hospital Stay: Admission: RE | Admit: 2024-03-07 | Source: Ambulatory Visit

## 2024-05-15 ENCOUNTER — Encounter: Payer: Self-pay | Admitting: Internal Medicine
